# Patient Record
Sex: Female | Born: 2000
Health system: Southern US, Community
[De-identification: ages and names within clinical notes are randomized; demographics above are authoritative.]

## PROBLEM LIST (undated history)

## (undated) DIAGNOSIS — K219 Gastro-esophageal reflux disease without esophagitis: Secondary | ICD-10-CM

## (undated) DIAGNOSIS — IMO0001 Reserved for inherently not codable concepts without codable children: Secondary | ICD-10-CM

## (undated) DIAGNOSIS — J302 Other seasonal allergic rhinitis: Secondary | ICD-10-CM

## (undated) DIAGNOSIS — F329 Major depressive disorder, single episode, unspecified: Secondary | ICD-10-CM

## (undated) DIAGNOSIS — F32A Depression, unspecified: Secondary | ICD-10-CM

## (undated) DIAGNOSIS — K589 Irritable bowel syndrome without diarrhea: Secondary | ICD-10-CM

## (undated) HISTORY — DX: Irritable bowel syndrome, unspecified: K58.9

---
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
## 2018-02-01 DIAGNOSIS — N912 Amenorrhea, unspecified: Secondary | ICD-10-CM | POA: Diagnosis not present

## 2018-03-04 ENCOUNTER — Emergency Department
Admission: EM | Admit: 2018-03-04 | Discharge: 2018-03-04 | Disposition: A | Discharge status: Home / Self Care | Payer: BLUE CROSS/BLUE SHIELD | Attending: Emergency Medicine | Admitting: Emergency Medicine

## 2018-03-04 ENCOUNTER — Encounter: Payer: Self-pay | Admitting: Emergency Medicine

## 2018-03-04 ENCOUNTER — Emergency Department: Payer: BLUE CROSS/BLUE SHIELD

## 2018-03-04 DIAGNOSIS — O209 Hemorrhage in early pregnancy, unspecified: Secondary | ICD-10-CM | POA: Diagnosis present

## 2018-03-04 DIAGNOSIS — O26851 Spotting complicating pregnancy, first trimester: Secondary | ICD-10-CM | POA: Diagnosis not present

## 2018-03-04 DIAGNOSIS — Z3A11 11 weeks gestation of pregnancy: Secondary | ICD-10-CM | POA: Diagnosis not present

## 2018-03-04 DIAGNOSIS — Z2913 Encounter for prophylactic Rho(D) immune globulin: Secondary | ICD-10-CM | POA: Insufficient documentation

## 2018-03-04 DIAGNOSIS — O039 Complete or unspecified spontaneous abortion without complication: Secondary | ICD-10-CM | POA: Diagnosis not present

## 2018-03-04 DIAGNOSIS — Z3A01 Less than 8 weeks gestation of pregnancy: Secondary | ICD-10-CM | POA: Diagnosis not present

## 2018-03-04 DIAGNOSIS — O021 Missed abortion: Secondary | ICD-10-CM | POA: Diagnosis not present

## 2018-03-04 LAB — ANTIBODY SCREEN: Antibody Screen: NEGATIVE

## 2018-03-04 LAB — ABO/RH: ABO/RH(D): A NEG

## 2018-03-04 LAB — HCG, QUANTITATIVE, PREGNANCY: HCG, BETA CHAIN, QUANT, S: 2637 m[IU]/mL — AB (ref <5)

## 2018-03-04 MED ORDER — RHO D IMMUNE GLOBULIN 1500 UNIT/2ML IJ SOSY
300.0000 ug | PREFILLED_SYRINGE | Freq: Once | INTRAMUSCULAR | Status: AC
  Administered 2018-03-04: 300 ug via INTRAMUSCULAR
  Filled 2018-03-04: qty 2

## 2018-03-04 NOTE — ED Triage Notes (Signed)
Pt reports she is [redacted] weeks pregnant and started to have vaginal bleeding today. Pt denies clots. Pt was seen by OBGYN and confirmed pregnancy by [place] on January 14.

## 2018-03-04 NOTE — ED Provider Notes (Addendum)
[facility] Emergency Department Provider Note  ____________________________________________   I have reviewed the triage vital signs and the nursing notes.   HISTORY  Chief Complaint Vaginal Bleeding   History limited by: Not Limited   HPI Cynthia Larson is a 18 y.o. female who presents to the emergency department today with concerns for vaginal bleeding in the setting of early pregnancy.  Patient states that she is roughly [redacted] weeks pregnant by ultrasound.  Today noticed some bleeding.  She denies any blood clots.  She denies any associated abdominal pain.  States is the first time she has noticed any bleeding with this pregnancy this is the patient's first pregnancy.  She denies any significant nausea or vomiting.   Per medical record review patient has a history of depression, IBS.   Past Medical History:  Diagnosis Date  . Depression    no meds  . IBS (irritable bowel syndrome)    Cyclic constipation/Diarrhea - diet controlled - no meds  . Reflux   . Seasonal allergies     Patient Active Problem List   Diagnosis Date Noted  . IBS (irritable bowel syndrome)     Past Surgical History:  Procedure Laterality Date  . CYSTOSCOPY N/A 08/04/2013   Procedure: CYSTOSCOPY;  Surgeon: Daria Pastures, MD;  Location: [facility];  Service: Gynecology;  Laterality: N/A;  . ROBOTIC ASSISTED LAPAROSCOPIC LYSIS OF ADHESION N/A 08/04/2013   Procedure: ROBOTIC ASSISTED LAPAROSCOPIC LYSIS OF ADHESION;  Surgeon: Daria Pastures, MD;  Location: [facility];  Service: Gynecology;  Laterality: N/A;  . ROBOTIC ASSISTED LAPAROSCOPIC OVARIAN CYSTECTOMY Left 08/04/2013   Procedure: ROBOTIC ASSISTED LAPAROSCOPIC left salpingoopherectomy with dermoid cyst removal;  Surgeon: Daria Pastures, MD;  Location: [facility];  Service: Gynecology;  Laterality: Left;    Prior to Admission medications   Medication Sig Start Date End Date Taking? Authorizing Provider  cetirizine (ZYRTEC)  10 MG tablet Take 10 mg by mouth daily.    [provider]  HYDROcodone-acetaminophen (NORCO/VICODIN) 5-325 MG tablet Take 1 tablet by mouth every 6 (six) hours as needed. 03/07/16   Billy Fischer, MD  ibuprofen (ADVIL,MOTRIN) 200 MG tablet Take 600 mg by mouth daily.    [provider]  norethindrone-ethinyl estradiol (JUNEL FE 1/20) 1-20 MG-MCG tablet Take 1 tablet by mouth daily.    [provider]  ranitidine (ZANTAC) 150 MG capsule Take 150 mg by mouth daily.    [provider]  ranitidine (ZANTAC) 75 MG tablet Take 150 mg by mouth daily.     [provider]    Allergies Patient has no known allergies.  History reviewed. No pertinent family history.  Social History Social History   Tobacco Use  . Smoking status: Never Smoker  . Smokeless tobacco: Never Used  Substance Use Topics  . Alcohol use: No  . Drug use: No    Review of Systems Constitutional: No fever/chills Eyes: No visual changes. ENT: No sore throat. Cardiovascular: Denies chest pain. Respiratory: Denies shortness of breath. Gastrointestinal: No abdominal pain.  No nausea, no vomiting.  No diarrhea.   Genitourinary: Positive for vaginal bleeding.  Musculoskeletal: Negative for back pain. Skin: Negative for rash. Neurological: Negative for headaches, focal weakness or numbness.  ____________________________________________   PHYSICAL EXAM:  VITAL SIGNS: ED Triage Vitals [03/04/18 2030]  Enc Vitals Group     BP 133/78     Pulse Rate (!) 102     Resp 18     Temp  99 F (37.2 C)     Temp Source Oral     SpO2 100 %   Constitutional: Alert and oriented.  Eyes: Conjunctivae are normal.  ENT      Head: Normocephalic and atraumatic.      Nose: No congestion/rhinnorhea.      Mouth/Throat: Mucous membranes are moist.      Neck: No stridor. Hematological/Lymphatic/Immunilogical: No cervical lymphadenopathy. Cardiovascular: Normal rate, regular rhythm.  No  murmurs, rubs, or gallops. Respiratory: Normal respiratory effort without tachypnea nor retractions. Breath sounds are clear and equal bilaterally. No wheezes/rales/rhonchi. Gastrointestinal: Soft and non tender. No rebound. No guarding.  Genitourinary: Deferred Musculoskeletal: Normal range of motion in all extremities. No lower extremity edema. Neurologic:  Normal speech and language. No gross focal neurologic deficits are appreciated.  Skin:  Skin is warm, dry and intact. No rash noted. Psychiatric: Mood and affect are normal. Speech and behavior are normal. Patient exhibits appropriate insight and judgment.  ____________________________________________    LABS (pertinent positives/negatives)  hcg 2,637 A NEG  ____________________________________________   EKG  None  ____________________________________________    RADIOLOGY  [place] No cardiac activity.   ____________________________________________   PROCEDURES  Procedures  ____________________________________________   INITIAL IMPRESSION / ASSESSMENT AND PLAN / ED COURSE  Pertinent labs & imaging results that were available during my care of the patient were reviewed by me and considered in my medical decision making (see chart for details).   Patient presented to the emergency department today because of concerns for vaginal bleeding in first trimester.  Patient's beta hCG is only 2000 which is inconsistent with patient's dates.  Ultrasound unfortunately does not show any cardiac activity and is consistent with fetal demise.  Discussed this with the patient.  She was a negative so RhoGam was administered.  Discussed expectant management.  Did offer cytotec however patient declined. Also discussed D and C with patient as a possibility. At this time she would like to try expectant management. Discussed importance of following up with ob/gyn.  ____________________________________________   FINAL CLINICAL IMPRESSION(S)  / ED DIAGNOSES  Final diagnoses:  Miscarriage     Note: This dictation was prepared with Dragon dictation. Any transcriptional errors that result from this process are unintentional     Nance Pear, MD 03/04/18 [id number]    Nance Pear, MD 03/04/18 2242

## 2018-03-04 NOTE — ED Notes (Signed)
No peripheral IV placed this visit.    Discharge instructions reviewed with patient. Questions fielded by this RN. Patient verbalizes understanding of instructions. Patient discharged home in stable condition per [facility]. No acute distress noted at time of discharge.   No signs of reaction at IM site

## 2018-03-04 NOTE — ED Notes (Addendum)
ED Provider at bedside.  Pt reports 11 weeks preg ([place] confirmed with Hassan Buckler, NP at [facility]) , started "spotting" today, painless, denies sickness, nausea, vomiting or rashes  Pt with sig other  First preg

## 2018-03-04 NOTE — Discharge Instructions (Signed)
Please seek medical attention for any high fevers, chest pain, shortness of breath, change in behavior, persistent vomiting, bloody stool or any other new or concerning symptoms.  

## 2018-03-05 LAB — RHOGAM INJECTION: UNIT DIVISION: 0

## 2018-03-08 DIAGNOSIS — O034 Incomplete spontaneous abortion without complication: Secondary | ICD-10-CM | POA: Diagnosis not present

## 2018-03-16 DIAGNOSIS — O021 Missed abortion: Secondary | ICD-10-CM | POA: Diagnosis not present

## 2018-03-22 DIAGNOSIS — O021 Missed abortion: Secondary | ICD-10-CM | POA: Diagnosis not present

## 2018-03-22 DIAGNOSIS — Z3009 Encounter for other general counseling and advice on contraception: Secondary | ICD-10-CM | POA: Diagnosis not present

## 2018-04-06 DIAGNOSIS — O021 Missed abortion: Secondary | ICD-10-CM | POA: Diagnosis not present

## 2018-07-06 DIAGNOSIS — N912 Amenorrhea, unspecified: Secondary | ICD-10-CM | POA: Diagnosis not present

## 2018-07-25 DIAGNOSIS — N898 Other specified noninflammatory disorders of vagina: Secondary | ICD-10-CM | POA: Diagnosis not present

## 2018-07-25 DIAGNOSIS — O26891 Other specified pregnancy related conditions, first trimester: Secondary | ICD-10-CM | POA: Diagnosis not present

## 2018-07-25 DIAGNOSIS — Z3481 Encounter for supervision of other normal pregnancy, first trimester: Secondary | ICD-10-CM | POA: Diagnosis not present

## 2018-07-25 LAB — OB RESULTS CONSOLE ABO/RH: RH Type: NEGATIVE

## 2018-07-25 LAB — OB RESULTS CONSOLE VARICELLA ZOSTER ANTIBODY, IGG: Varicella: IMMUNE

## 2018-07-25 LAB — OB RESULTS CONSOLE HEPATITIS B SURFACE ANTIGEN: Hepatitis B Surface Ag: NEGATIVE

## 2018-07-25 LAB — OB RESULTS CONSOLE RUBELLA ANTIBODY, IGM: Rubella: IMMUNE

## 2018-08-23 DIAGNOSIS — Z3482 Encounter for supervision of other normal pregnancy, second trimester: Secondary | ICD-10-CM | POA: Diagnosis not present

## 2018-08-23 DIAGNOSIS — A749 Chlamydial infection, unspecified: Secondary | ICD-10-CM | POA: Diagnosis not present

## 2018-08-23 DIAGNOSIS — O98812 Other maternal infectious and parasitic diseases complicating pregnancy, second trimester: Secondary | ICD-10-CM | POA: Diagnosis not present

## 2018-09-28 DIAGNOSIS — R000 Tachycardia, unspecified: Secondary | ICD-10-CM | POA: Diagnosis not present

## 2018-10-10 DIAGNOSIS — N938 Other specified abnormal uterine and vaginal bleeding: Secondary | ICD-10-CM | POA: Diagnosis not present

## 2018-10-10 DIAGNOSIS — Z23 Encounter for immunization: Secondary | ICD-10-CM | POA: Diagnosis not present

## 2018-10-10 DIAGNOSIS — O368120 Decreased fetal movements, second trimester, not applicable or unspecified: Secondary | ICD-10-CM | POA: Diagnosis not present

## 2018-10-11 DIAGNOSIS — O26892 Other specified pregnancy related conditions, second trimester: Secondary | ICD-10-CM | POA: Diagnosis not present

## 2018-10-11 DIAGNOSIS — Z6791 Unspecified blood type, Rh negative: Secondary | ICD-10-CM | POA: Diagnosis not present

## 2018-10-11 DIAGNOSIS — O4690 Antepartum hemorrhage, unspecified, unspecified trimester: Secondary | ICD-10-CM | POA: Diagnosis not present

## 2018-11-24 DIAGNOSIS — Z6791 Unspecified blood type, Rh negative: Secondary | ICD-10-CM | POA: Diagnosis not present

## 2018-11-24 DIAGNOSIS — Z3482 Encounter for supervision of other normal pregnancy, second trimester: Secondary | ICD-10-CM | POA: Diagnosis not present

## 2018-11-24 DIAGNOSIS — O26893 Other specified pregnancy related conditions, third trimester: Secondary | ICD-10-CM | POA: Diagnosis not present

## 2018-12-22 DIAGNOSIS — Z23 Encounter for immunization: Secondary | ICD-10-CM | POA: Diagnosis not present

## 2019-01-17 DIAGNOSIS — Z3482 Encounter for supervision of other normal pregnancy, second trimester: Secondary | ICD-10-CM | POA: Diagnosis not present

## 2019-01-17 LAB — OB RESULTS CONSOLE GC/CHLAMYDIA
Chlamydia: NEGATIVE
Gonorrhea: NEGATIVE

## 2019-01-17 LAB — OB RESULTS CONSOLE RPR: RPR: NONREACTIVE

## 2019-01-17 LAB — OB RESULTS CONSOLE HIV ANTIBODY (ROUTINE TESTING): HIV: NONREACTIVE

## 2019-01-17 LAB — OB RESULTS CONSOLE GBS: GBS: NEGATIVE

## 2019-01-20 ENCOUNTER — Encounter: Payer: Self-pay | Admitting: *Deleted

## 2019-01-20 ENCOUNTER — Observation Stay
Admission: EM | Admit: 2019-01-20 | Discharge: 2019-01-20 | Disposition: A | Discharge status: Home / Self Care | Payer: Medicaid Other | Attending: Certified Nurse Midwife | Admitting: Certified Nurse Midwife

## 2019-01-20 DIAGNOSIS — O479 False labor, unspecified: Secondary | ICD-10-CM | POA: Diagnosis present

## 2019-01-20 DIAGNOSIS — Z791 Long term (current) use of non-steroidal anti-inflammatories (NSAID): Secondary | ICD-10-CM | POA: Diagnosis not present

## 2019-01-20 DIAGNOSIS — Z3A00 Weeks of gestation of pregnancy not specified: Secondary | ICD-10-CM | POA: Diagnosis not present

## 2019-01-20 NOTE — L&D Delivery Note (Signed)
Delivery Note  Date of delivery: 01/28/2019 Estimated Date of Delivery: 02/11/19 Patient's last menstrual period was 05/07/2018. EGA: [redacted]w[redacted]d  First Stage: Labor onset: 0530 01/28/2019 Augmentation : AROM Analgesia Cynthia Larson intrapartum: epidural AROM at Cynthia Larson presented to L&D with contractions. She was initially expectantly management. Epidural placed for pain relief. She was then augmented with AROM.    Second Stage: Complete dilation at 1629 Onset of pushing at 1639 FHR second stage category I Delivery at [id number] on 01/28/2019  She progressed to complete and had a spontaneous vaginal birth of a live female over an intact perineum. The fetal head was delivered in direct OA position with restitution to ROA. No nuchal cord. Anterior then posterior shoulders delivered with minimal assistance. Baby placed on mom's abdomen and attended to by transition RN. Cord clamped and cut after 2 minute delay by grandmother of the baby. Cord blood obtained for newborn labs.  Third Stage: Placenta delivered spontaneously intact with 3VC at 1657 Placenta disposition: routine disposal Uterine tone firm / bleeding scant IV pitocin given for hemorrhage prophylaxis  B/l labial and second degree vaginal lacerations identified  Anesthesia for repair: epidural Repair 3-0 Vicryl Rapide Est. Blood Loss (mL): 123XX123  Complications: none  Mom to postpartum.  Baby to Couplet care / Skin to Skin.  Newborn: Birth Weight: pending  Apgar Scores: 9, 9 Feeding planned: breast   Cynthia Larson, CNM 01/28/2019 5:25 PM

## 2019-01-20 NOTE — Discharge Summary (Signed)
Patient ID: Cynthia Larson MRN: [id number] DOB/AGE: 2000/07/28 19 y.o.  Admit date: 01/20/2019 Discharge date: 01/20/2019  Admission Diagnoses: Contractions  Discharge Diagnoses: Early labor vs [place]  Prenatal Procedures: NST  Consults: none  Significant Diagnostic Studies:  No results found for this or any previous visit (from the past 168 hour(s)).  Treatments: oral hydration  Hospital Course:  See triage note  Discharge Physical Exam:  BP 112/67 (BP Location: Left Arm)   Pulse 82   Temp 98.6 F (37 C) (Oral)   Resp 16   LMP 05/07/2018   Breastfeeding Unknown   General: NAD CV: RRR Pulm: CTABL, nl effort ABD: s/nd/nt, gravid DVT Evaluation: LE non-ttp, no evidence of DVT on exam.     Discharge Condition: Stable  Disposition: Discharge disposition: 01-Home or Self Care        Allergies as of 01/20/2019   No Known Allergies     Medication List    STOP taking these medications   HYDROcodone-acetaminophen 5-325 MG tablet Commonly known as: NORCO/VICODIN   ibuprofen 200 MG tablet Commonly known as: ADVIL   Junel FE 1/20 1-20 MG-MCG tablet Generic drug: norethindrone-ethinyl estradiol     TAKE these medications   cetirizine 10 MG tablet Commonly known as: ZYRTEC Take 10 mg by mouth daily.   prenatal multivitamin Tabs tablet Take 1 tablet by mouth daily at 12 noon.   ranitidine 150 MG capsule Commonly known as: ZANTAC Take 150 mg by mouth daily. What changed: Another medication with the same name was removed. Continue taking this medication, and follow the directions you see here.      Follow-up [facility] Follow up.   Contact information: [address] [place] [id number]          Signed:  Regina Eck 01/20/2019 5:21 AM

## 2019-01-20 NOTE — OB Triage Note (Addendum)
Recvd pt from ED. Pt c/o contractions every 3-4 min. Stated she was seen on 12/31 and was told to come in if contractions continued. No LOF or vaginal bleeding. Feeling baby move well. Rates pain a 6 out of 10. Pt reports not drinking much water today.

## 2019-01-20 NOTE — OB Triage Note (Signed)
Triage Visit with NST    Cynthia Larson is a 19 y.o. G1P0. She is at [redacted]w[redacted]d gestation. Patient's last menstrual period was 05/07/2018. Estimated Date of Delivery: 02/11/19 Prenatal care site: [facility]   Chief Complaint: Contractions  Subjective:  Resting comfortably.  Denies VB, LOF   Active fetal movement.   Past Medical History:  Diagnosis Date  . Depression    no meds  . IBS (irritable bowel syndrome)    Cyclic constipation/Diarrhea - diet controlled - no meds  . Reflux   . Seasonal allergies     Past Surgical History:  Procedure Laterality Date  . CYSTOSCOPY N/A 08/04/2013   Procedure: CYSTOSCOPY;  Surgeon: Daria Pastures, MD;  Location: [facility];  Service: Gynecology;  Laterality: N/A;  . ROBOTIC ASSISTED LAPAROSCOPIC LYSIS OF ADHESION N/A 08/04/2013   Procedure: ROBOTIC ASSISTED LAPAROSCOPIC LYSIS OF ADHESION;  Surgeon: Daria Pastures, MD;  Location: [facility];  Service: Gynecology;  Laterality: N/A;  . ROBOTIC ASSISTED LAPAROSCOPIC OVARIAN CYSTECTOMY Left 08/04/2013   Procedure: ROBOTIC ASSISTED LAPAROSCOPIC left salpingoopherectomy with dermoid cyst removal;  Surgeon: Daria Pastures, MD;  Location: [facility];  Service: Gynecology;  Laterality: Left;    No Known Allergies  Prior to Admission medications   Medication Sig Start Date End Date Taking? Authorizing Provider  Prenatal Vit-Fe Fumarate-FA (PRENATAL MULTIVITAMIN) TABS tablet Take 1 tablet by mouth daily at 12 noon.   Yes [provider]  cetirizine (ZYRTEC) 10 MG tablet Take 10 mg by mouth daily.    [provider]  HYDROcodone-acetaminophen (NORCO/VICODIN) 5-325 MG tablet Take 1 tablet by mouth every 6 (six) hours as needed. 03/07/16   Billy Fischer, MD  ibuprofen (ADVIL,MOTRIN) 200 MG tablet Take 600 mg by mouth daily.    [provider]  norethindrone-ethinyl estradiol (JUNEL FE 1/20) 1-20 MG-MCG tablet Take 1 tablet by mouth daily.    [provider]   ranitidine (ZANTAC) 150 MG capsule Take 150 mg by mouth daily.    [provider]  ranitidine (ZANTAC) 75 MG tablet Take 150 mg by mouth daily.     [provider]     Social History:  reports that she has never smoked. She has never used smokeless tobacco. She reports that she does not drink alcohol or use drugs.  Family History: family history is not on file.   Review of Systems: A full review of systems was performed and negative except as noted in the HPI.    Objective:  BP 112/67 (BP Location: Left Arm)   Pulse 82   Temp 98.6 F (37 C) (Oral)   Resp 16   LMP 05/07/2018   Breastfeeding Unknown  No results found for this or any previous visit (from the past 24 hour(s)).    Gen: NAD, AAOx3      Abd: FNTTP      Ext: Non-tender, Nonedmeatous    NST: FHR baseline: 135 bpm Variability: moderate Accelerations: yes Decelerations: none Category/reactivity: reactive  TOCO: Q 1-5 min  Presentations: cephalic  Cervix: @ [id number]   Dilation: 1cm   Effacement: 50%   Station:  -1   Consistency: medium   Position: middle   Cervix: @ 0203   Dilation: 2cm   Effacement: 50%   Station:  -1   Consistency: medium   Position: middle   Cervix: @ 0505   Dilation: 2cm   Effacement: 50%   Station:  -1   Consistency: medium   Position: middle  Assessment:   [name] here for r/o labor   Principle Diagnosis:  Early labor vs [place]  Plan:   Active Labor: not present.   Fetal Wellbeing: NST is reassuring, reactive tracing   D/c home stable, precautions reviewed, follow-up as scheduled.    - Kick counts reviewed with the patient in detail.  Patient instructed to assess for fetal activity daily at regular intervals. Counts to be done if decreased activity noted. Patient instructed to contact the office if counts do not reveal adequate movements.  - Preterm labor precautions (ROM, vaginal bleeding, contractions/lower back pain, change in  discharge), along with how and when to call the office, reviewed. Patient verbalized understanding.  Linda Hedges, CNM 01/20/2019 2:14 AM

## 2019-01-20 NOTE — Discharge Instructions (Signed)

## 2019-01-20 NOTE — Progress Notes (Signed)
Discharge instructions given. Labor precautions reviewed with pt and  Pt verbalized understanding and had no questions. Discharged home with mother.

## 2019-01-28 ENCOUNTER — Inpatient Hospital Stay: Payer: Medicaid Other | Admitting: Anesthesiology

## 2019-01-28 ENCOUNTER — Inpatient Hospital Stay
Admission: EM | Admit: 2019-01-28 | Discharge: 2019-01-29 | DRG: 807 | Disposition: A | Discharge status: Home / Self Care | Payer: Medicaid Other | Attending: Certified Nurse Midwife | Admitting: Certified Nurse Midwife

## 2019-01-28 ENCOUNTER — Encounter: Payer: Self-pay | Admitting: Obstetrics and Gynecology

## 2019-01-28 ENCOUNTER — Other Ambulatory Visit: Payer: Self-pay

## 2019-01-28 DIAGNOSIS — O26893 Other specified pregnancy related conditions, third trimester: Secondary | ICD-10-CM | POA: Diagnosis present

## 2019-01-28 DIAGNOSIS — Z3A38 38 weeks gestation of pregnancy: Secondary | ICD-10-CM

## 2019-01-28 DIAGNOSIS — O358XX0 Maternal care for other (suspected) fetal abnormality and damage, not applicable or unspecified: Principal | ICD-10-CM | POA: Diagnosis present

## 2019-01-28 DIAGNOSIS — Z6791 Unspecified blood type, Rh negative: Secondary | ICD-10-CM | POA: Diagnosis not present

## 2019-01-28 DIAGNOSIS — Z20822 Contact with and (suspected) exposure to covid-19: Secondary | ICD-10-CM | POA: Diagnosis present

## 2019-01-28 LAB — CBC
HCT: 36.6 % (ref 36.0–46.0)
Hemoglobin: 12.5 g/dL (ref 12.0–15.0)
MCH: 29.3 pg (ref 26.0–34.0)
MCHC: 34.2 g/dL (ref 30.0–36.0)
MCV: 85.9 fL (ref 80.0–100.0)
Platelets: 149 10*3/uL — ABNORMAL LOW (ref 150–400)
RBC: 4.26 MIL/uL (ref 3.87–5.11)
RDW: 12.8 % (ref 11.5–15.5)
WBC: 12.4 10*3/uL — ABNORMAL HIGH (ref 4.0–10.5)
nRBC: 0 % (ref 0.0–0.2)

## 2019-01-28 LAB — RESPIRATORY PANEL BY RT PCR (FLU A&B, COVID)
Influenza A by PCR: NEGATIVE
Influenza B by PCR: NEGATIVE
SARS Coronavirus 2 by RT PCR: NEGATIVE

## 2019-01-28 MED ORDER — ACETAMINOPHEN 325 MG PO TABS: 650.0000 mg | ORAL_TABLET | ORAL | Status: DC | PRN

## 2019-01-28 MED ORDER — LIDOCAINE HCL (PF) 1 % IJ SOLN: 30.0000 mL | INTRAMUSCULAR | Status: DC | PRN

## 2019-01-28 MED ORDER — LACTATED RINGERS IV SOLN: 500.0000 mL | Freq: Once | INTRAVENOUS | Status: DC

## 2019-01-28 MED ORDER — FENTANYL 2.5 MCG/ML W/ROPIVACAINE 0.15% IN NS 100 ML EPIDURAL (ARMC)
12.0000 mL/h | EPIDURAL | Status: DC
  Administered 2019-01-28: 14:00:00 12 mL/h via EPIDURAL

## 2019-01-28 MED ORDER — OXYTOCIN 40 UNITS IN NORMAL SALINE INFUSION - SIMPLE MED
2.5000 [IU]/h | INTRAVENOUS | Status: DC
  Administered 2019-01-28: 17:00:00 2.5 [IU]/h via INTRAVENOUS

## 2019-01-28 MED ORDER — DIPHENHYDRAMINE HCL 25 MG PO CAPS: 25.0000 mg | ORAL_CAPSULE | Freq: Four times a day (QID) | ORAL | Status: DC | PRN

## 2019-01-28 MED ORDER — OXYTOCIN 40 UNITS IN NORMAL SALINE INFUSION - SIMPLE MED
INTRAVENOUS | Status: AC
  Filled 2019-01-28: qty 1000

## 2019-01-28 MED ORDER — EPHEDRINE 5 MG/ML INJ: 10.0000 mg | INTRAVENOUS | Status: DC | PRN

## 2019-01-28 MED ORDER — PRENATAL MULTIVITAMIN CH
1.0000 | ORAL_TABLET | Freq: Every day | ORAL | Status: DC
  Administered 2019-01-29: 13:00:00 1 via ORAL
  Filled 2019-01-28: qty 1

## 2019-01-28 MED ORDER — SENNOSIDES-DOCUSATE SODIUM 8.6-50 MG PO TABS
2.0000 | ORAL_TABLET | ORAL | Status: DC
  Administered 2019-01-29: 2 via ORAL
  Filled 2019-01-28: qty 2

## 2019-01-28 MED ORDER — LACTATED RINGERS IV SOLN: 500.0000 mL | INTRAVENOUS | Status: DC | PRN

## 2019-01-28 MED ORDER — LIDOCAINE-EPINEPHRINE (PF) 1.5 %-1:200000 IJ SOLN
INTRAMUSCULAR | Status: DC | PRN
  Administered 2019-01-28: 3 mL via EPIDURAL

## 2019-01-28 MED ORDER — MISOPROSTOL 200 MCG PO TABS
ORAL_TABLET | ORAL | Status: AC
  Filled 2019-01-28: qty 4

## 2019-01-28 MED ORDER — PHENYLEPHRINE 40 MCG/ML (10ML) SYRINGE FOR IV PUSH (FOR BLOOD PRESSURE SUPPORT): 80.0000 ug | PREFILLED_SYRINGE | INTRAVENOUS | Status: DC | PRN

## 2019-01-28 MED ORDER — LIDOCAINE HCL (PF) 1 % IJ SOLN
INTRAMUSCULAR | Status: AC
  Filled 2019-01-28: qty 30

## 2019-01-28 MED ORDER — ACETAMINOPHEN 325 MG PO TABS
650.0000 mg | ORAL_TABLET | ORAL | Status: DC | PRN
  Administered 2019-01-28: 22:00:00 650 mg via ORAL
  Filled 2019-01-28: qty 2

## 2019-01-28 MED ORDER — ONDANSETRON HCL 4 MG PO TABS: 4.0000 mg | ORAL_TABLET | ORAL | Status: DC | PRN

## 2019-01-28 MED ORDER — OXYTOCIN BOLUS FROM INFUSION
500.0000 mL | Freq: Once | INTRAVENOUS | Status: AC
  Administered 2019-01-28: 500 mL via INTRAVENOUS

## 2019-01-28 MED ORDER — AMMONIA AROMATIC IN INHA
RESPIRATORY_TRACT | Status: AC
  Filled 2019-01-28: qty 10

## 2019-01-28 MED ORDER — BENZOCAINE-MENTHOL 20-0.5 % EX AERO
1.0000 "application " | INHALATION_SPRAY | CUTANEOUS | Status: DC | PRN
  Administered 2019-01-28: 1 via TOPICAL
  Filled 2019-01-28: qty 56

## 2019-01-28 MED ORDER — SIMETHICONE 80 MG PO CHEW: 160.0000 mg | CHEWABLE_TABLET | ORAL | Status: DC | PRN

## 2019-01-28 MED ORDER — WITCH HAZEL-GLYCERIN EX PADS: 1.0000 "application " | MEDICATED_PAD | CUTANEOUS | Status: DC

## 2019-01-28 MED ORDER — DIBUCAINE (PERIANAL) 1 % EX OINT: 1.0000 "application " | TOPICAL_OINTMENT | CUTANEOUS | Status: DC | PRN

## 2019-01-28 MED ORDER — IBUPROFEN 600 MG PO TABS
600.0000 mg | ORAL_TABLET | Freq: Four times a day (QID) | ORAL | Status: DC
  Administered 2019-01-28 – 2019-01-29 (×3): 600 mg via ORAL
  Filled 2019-01-28 (×4): qty 1

## 2019-01-28 MED ORDER — DIPHENHYDRAMINE HCL 50 MG/ML IJ SOLN: 12.5000 mg | INTRAMUSCULAR | Status: DC | PRN

## 2019-01-28 MED ORDER — SOD CITRATE-CITRIC ACID 500-334 MG/5ML PO SOLN: 30.0000 mL | ORAL | Status: DC | PRN

## 2019-01-28 MED ORDER — LACTATED RINGERS IV SOLN: INTRAVENOUS | Status: DC

## 2019-01-28 MED ORDER — COCONUT OIL OIL: 1.0000 "application " | TOPICAL_OIL | Status: DC | PRN

## 2019-01-28 MED ORDER — ONDANSETRON HCL 4 MG/2ML IJ SOLN: 4.0000 mg | Freq: Four times a day (QID) | INTRAMUSCULAR | Status: DC | PRN

## 2019-01-28 MED ORDER — OXYTOCIN 10 UNIT/ML IJ SOLN
INTRAMUSCULAR | Status: AC
  Filled 2019-01-28: qty 2

## 2019-01-28 MED ORDER — ONDANSETRON HCL 4 MG/2ML IJ SOLN
4.0000 mg | INTRAMUSCULAR | Status: DC | PRN
  Administered 2019-01-28: 21:00:00 4 mg via INTRAVENOUS
  Filled 2019-01-28: qty 2

## 2019-01-28 MED ORDER — BUTORPHANOL TARTRATE 1 MG/ML IJ SOLN: 1.0000 mg | INTRAMUSCULAR | Status: DC | PRN

## 2019-01-28 MED ORDER — FENTANYL 2.5 MCG/ML W/ROPIVACAINE 0.15% IN NS 100 ML EPIDURAL (ARMC)
EPIDURAL | Status: AC
  Filled 2019-01-28: qty 100

## 2019-01-28 NOTE — Discharge Instructions (Signed)
After Your Delivery Discharge Instructions   Postpartum: Care Instructions  After childbirth (postpartum period), your body goes through many changes. Some of these changes happen over several weeks. In the hours after delivery, your body will begin to recover from childbirth while it prepares to breastfeed your newborn. You may feel emotional during this time. Your hormones can shift your mood without warning for no clear reason.  In the first couple of weeks after childbirth, many women have emotions that change from happy to sad. You may find it hard to sleep. You may cry a lot. This is called the "baby blues." These overwhelming emotions often go away within a couple of days or weeks. But it's important to discuss your feelings with your doctor.  You should call your care provider if you have unrelieved feelings of:  Inability to cope  Sadness  Anxiety  Lack of interest in baby  Insomnia  Crying  It is easy to get too tired and overwhelmed during the first weeks after childbirth. Don't try to do too much. Get rest whenever you can, accept help from others, and eat well and drink plenty of fluids.  About 4 to 6 weeks after your baby's birth, you will have a follow-up visit with your care provider. This visit is your time to talk to your provider about anything you are concerned or curious about.  Follow-up care is a key part of your treatment and safety. Be sure to make and go to all appointments, and call your doctor if you are having problems. It's also a good idea to know your test results and keep a list of the medicines you take.  How can you care for yourself at home?  Sleep or rest when your baby sleeps.  Get help with household chores from family or friends, if you can. Do not try to do it all yourself.  If you have hemorrhoids or swelling or pain around the opening of your vagina, try using cold and heat. You can put ice or a cold pack on the area for 10 to 20 minutes at  a time. Put a thin cloth between the ice and your skin. Also try sitting in a few inches of warm water (sitz bath) 3 times a day and after bowel movements.  Take pain medicines exactly as directed.  If the provider gave you a prescription medicine for pain, take it as prescribed.  If you do not have a prescription and need something over the counter, you can take:  Ibuprofen (Motrin, Advil) up to 600mg every 6 hours as needed for pain  Acetaminophen (Tylenol) up to 650mg every 4 hours as needed for pain  Some people find it helpful to alternate between these two medications.   No driving for 1-2 weeks or while taking pain medications.   Eat more fiber to avoid constipation. Include foods such as whole-grain breads and cereals, raw vegetables, raw and dried fruits, and beans.  Drink plenty of fluids, enough so that your urine is light yellow or clear like water. If you have kidney, heart, or liver disease and have to limit fluids, talk with your doctor before you increase the amount of fluids you drink.  Do not put anything in the vagina for 6 weeks. This means no sex, no tampons, no douching, and no enemas.  If you have stitches, keep the area clean by pouring or spraying warm water over the area outside your vagina and anus after you use the toilet.    No strenuous activity or heavy lifting for 6 weeks   No tub baths; showers only  Continue prenatal vitamin and iron.  If breastfeeding:  Increase calories and fluids while breastfeeding.  You may have a slight fever when your milk comes in, but it should go away on its own. If it does not, and rises above 101.0 please call the doctor.  For breastfeeding concerns, the lactation consultant can be reached at [phone].  For concerns about your baby, please call your pediatrician.   Keep a list of questions to bring to your postpartum visit. Your questions might be about:  Changes in your breasts, such as lumps or  soreness.  When to expect your menstrual period to start again.  What form of birth control is best for you.  Weight you have put on during the pregnancy.  Exercise options.  What foods and drinks are best for you, especially if you are breastfeeding.  Problems you might be having with breastfeeding.  When you can have sex. Some women may want to talk about lubricants for the vagina.  Any feelings of sadness or restlessness that you are having.   When should you call for help?  Call 911 anytime you think you may need emergency care. For example, call if:  You have thoughts of harming yourself, your baby, or another person.  You passed out (lost consciousness).  Call the office at [phone] or seek immediate medical care if:  If you have heavy bleeding such that you are soaking 1 pad in an hour for 2 hours  You are dizzy or lightheaded, or you feel like you may faint.  You have a fever; a temperature of 101.0 F or greater  Chills  Difficulty urinating  Headache unrelieved by "pain meds"   Visual changes  Pain in the right side of your belly near your ribs  Breasts reddened, hard, hot to the touch or any other breast concerns  Nipple discharge which is foul-smelling or contains pus   Increased pain at the site of the tear   New pain unrelieved with recommended over-the-counter dosages  Difficulty breathing with or without chest pain   New leg pain, swelling, or redness, especially if it is only on one leg  Any other concerns  Watch closely for changes in your health, and be sure to contact your provider if:  You have new or worse vaginal discharge.  You feel sad or depressed.  You are having problems with your breasts or breastfeeding.

## 2019-01-28 NOTE — Anesthesia Procedure Notes (Signed)
Epidural Patient location during procedure: OB Start time: 01/28/2019 1:16 PM End time: 01/28/2019 1:30 PM  Staffing Anesthesiologist: Alphonsus Sias, MD Performed: anesthesiologist   Preanesthetic Checklist Completed: patient identified, IV checked, site marked, risks and benefits discussed, surgical consent, monitors and equipment checked, pre-op evaluation and timeout performed  Epidural Patient position: sitting Prep: ChloraPrep Patient monitoring: heart rate, continuous pulse ox and blood pressure Approach: right paramedian Location: L3-L4 Injection technique: LOR saline  Needle:  Needle type: Tuohy  Needle gauge: 18 G Needle length: 9 cm and 9 Needle insertion depth: 5.5 cm Catheter type: closed end flexible Catheter size: 20 Guage Test dose: negative and 1.5% lidocaine with Epi 1:200 K  Assessment Events: blood not aspirated, injection not painful, no injection resistance, no paresthesia and negative IV test  Additional Notes   Patient tolerated the insertion well without complications. 1st pass LOR no difficulties.Reason for block:procedure for pain

## 2019-01-28 NOTE — Discharge Summary (Signed)
Obstetric Discharge Summary   Patient Name: Cynthia Larson DOB: [birth year] MRN: [id number]  Date of Admission: 01/28/2019 Date of Delivery: 01/28/2019 Delivered by: Cynthia Larson, CNM Date of Discharge: 01/29/2019  [facility] OB: [facility]  [id number] last menstrual period was 05/07/2018. EDC Estimated Date of Delivery: 02/11/19 Gestational Age at Delivery: [redacted]w[redacted]d   Antepartum complications:  1. Palpitations at [redacted]w[redacted]d, history of POTS: CBC, TSH, and EKG WNL 2. Rh negative, s/p RhoGAM 10/11/2018, 11/24/2018 3. Chlamydia at new OB, treated, retest negative 08/23/2018 4. Mild fetal hydronephrosis: 7.17mm at 37w  Admitting Diagnosis: labor  Secondary Diagnoses: Patient Active Problem List   Diagnosis Date Noted  . Labor and delivery indication for care or intervention 01/28/2019  . Uterine contractions during pregnancy 01/20/2019  . IBS (irritable bowel syndrome)     Augmentation: AROM Complications: None Intrapartum complications/course: She presented to L&D with contractions. She was initially expectantly management. Epidural placed for pain relief. She was then augmented with AROM. She progressed to complete and had a spontaneous vaginal birth of a live female over an intact perineum. The fetal head was delivered in direct OA position with restitution to ROA. No nuchal cord. Anterior then posterior shoulders delivered with minimal assistance. Baby placed on mom's abdomen and attended to by transition RN. Cord clamped and cut after 2 minute delay by grandmother of the baby. Cord blood obtained for newborn labs. Placenta delivered spontaneously intact with 3VC. IV pitocin given for hemorrhage prophylaxis. B/l labial and second degree vaginal lacerations identified. Vaginal laceration repaired for hemostasis. Right labial laceration repaired for cosmesis.   Delivery Type: spontaneous vaginal delivery Anesthesia: epidural Placenta: spontaneous Laceration: b/l labial, second degree  vaginal Episiotomy: none  Newborn Data: Live born female "[place]" Birth Weight:  3500g 7lb 11.5oz APGAR: 9, 9  Newborn Delivery   Birth date/time: 01/28/2019 16:52:00 Delivery type: Vaginal, Spontaneous      Postpartum Course  Patient had an uncomplicated postpartum course.  By time of discharge on PPD#1, her pain was controlled on oral pain medications; she had appropriate lochia and was ambulating, voiding without difficulty and tolerating regular diet.  She was deemed stable for discharge to home.       Labs: CBC Latest Ref Rng & Units 01/29/2019 01/28/2019 12/06/2017  WBC 4.0 - 10.5 K/uL 13.1(H) 12.4(H) 6.8  Hemoglobin 12.0 - 15.0 g/dL 11.2(L) 12.5 14.5  Hematocrit 36.0 - 46.0 % 33.1(L) 36.6 42.6  Platelets 150 - 400 K/uL 152 149(L) 262   A NEG  Physical exam:  BP 106/70 (BP Location: Right Arm)   Pulse 72   Temp 98.6 F (37 C) (Oral)   Resp 20   Ht 5\' 6"  (1.676 m)   Wt 75.8 kg   LMP 05/07/2018   SpO2 98%   Breastfeeding Unknown   BMI 26.95 kg/m  General: alert and no distress Pulm: normal respiratory effort Lochia: appropriate Abdomen: soft, NT Uterine Fundus: firm, below umbilicus Extremities: No evidence of DVT seen on physical exam. No lower extremity edema.   Disposition: stable, discharge to home Baby Feeding: breastmilk & formula Baby Disposition: home with mom  Contraception: OCPs  Prenatal Labs:  Blood type/Rh A neg  Antibody screen neg  Rubella Immune  Varicella Immune  RPR NR  HBsAg Neg  HIV NR  GC neg  Chlamydia Positive at 11w, treated, negative at 36w  Genetic screening cfDNA negative, XY; AFP negative  1 hour GTT 84  3 hour GTT n/a  GBS negative   Rh Immune  globulin given: n/a, baby A neg Rubella vaccine given: n/a Varicella vaccine given: n/ Tdap vaccine given in AP or PP setting: AP 12/22/2018 Flu vaccine given in AP or PP setting: AP 10/10/2018  Plan:  Cynthia Larson was discharged to home in good condition. Follow-up  appointment at [facility] with delivery provider in 6 weeks  Discharge Instructions: Per After Visit Summary. Activity: Advance as tolerated. Pelvic rest for 6 weeks.   Diet: Regular Discharge Medications: Allergies as of 01/29/2019   No Known Allergies     Medication List    STOP taking these medications   ondansetron 4 MG tablet Commonly known as: ZOFRAN   ranitidine 150 MG capsule Commonly known as: ZANTAC     TAKE these medications   acetaminophen 325 MG tablet Commonly known as: Tylenol Take 2 tablets (650 mg total) by mouth every 4 (four) hours as needed for mild pain or moderate pain.   cetirizine 10 MG tablet Commonly known as: ZYRTEC Take 10 mg by mouth daily.   ibuprofen 600 MG tablet Commonly known as: ADVIL Take 1 tablet (600 mg total) by mouth every 6 (six) hours.   prenatal multivitamin Tabs tablet Take 1 tablet by mouth daily at 12 noon.      Outpatient follow up:  Follow-up Information    Cynthia Larson, CNM. Schedule an appointment as soon as possible for a visit in 6 week(s).   Specialty: Certified Nurse Midwife Why: For routine postpartum visit Contact information: [address] [place] [address] [phone]            Signed:  Lisette Larson, CNM 01/29/2019 5:30 PM

## 2019-01-28 NOTE — H&P (Signed)
OB History & Physical   History of Present Illness:  Chief Complaint: contractions  HPI:  Cynthia Larson is a 19 y.o. G2P0010 female at [redacted]w[redacted]d dated by LMP c/w [redacted]w[redacted]d ultrasound.  She presents to L&D with reports of contractions.   She reports:  -active fetal movement -no leakage of fluid  -no vaginal bleeding -onset of contractions at 0530 currently every 3-4 minutes  Pregnancy Issues: 1. Palpitations at [redacted]w[redacted]d, history of POTS: CBC, TSH, and EKG WNL 2. Rh negative, s/p RhoGAM 10/11/2018, 11/24/2018 3. Chlamydia at new OB, treated, retest negative 08/23/2018 4. Mild fetal hydronephrosis: 7.62mm at 37w   Maternal Medical History:   Past Medical History:  Diagnosis Date  . Depression    no meds  . IBS (irritable bowel syndrome)    Cyclic constipation/Diarrhea - diet controlled - no meds  . Reflux   . Seasonal allergies     Past Surgical History:  Procedure Laterality Date  . CYSTOSCOPY N/A 08/04/2013   Procedure: CYSTOSCOPY;  Surgeon: Daria Pastures, MD;  Location: [facility];  Service: Gynecology;  Laterality: N/A;  . ROBOTIC ASSISTED LAPAROSCOPIC LYSIS OF ADHESION N/A 08/04/2013   Procedure: ROBOTIC ASSISTED LAPAROSCOPIC LYSIS OF ADHESION;  Surgeon: Daria Pastures, MD;  Location: [facility];  Service: Gynecology;  Laterality: N/A;  . ROBOTIC ASSISTED LAPAROSCOPIC OVARIAN CYSTECTOMY Left 08/04/2013   Procedure: ROBOTIC ASSISTED LAPAROSCOPIC left salpingoopherectomy with dermoid cyst removal;  Surgeon: Daria Pastures, MD;  Location: [facility];  Service: Gynecology;  Laterality: Left;    No Known Allergies  Prior to Admission medications   Medication Sig Start Date End Date Taking? Authorizing Provider  ondansetron (ZOFRAN) 4 MG tablet Take 4 mg by mouth every 8 (eight) hours as needed for nausea or vomiting.   Yes [provider]  Prenatal Vit-Fe Fumarate-FA (PRENATAL MULTIVITAMIN) TABS tablet Take 1 tablet by mouth daily at 12 noon.   Yes [provider]   cetirizine (ZYRTEC) 10 MG tablet Take 10 mg by mouth daily.    [provider]  ranitidine (ZANTAC) 150 MG capsule Take 150 mg by mouth daily.    [provider]     Prenatal care site: [facility]   Social History: She  reports that she has never smoked. She has never used smokeless tobacco. She reports that she does not drink alcohol or use drugs.  Family History: family history includes Heart attack in her father; Thyroid disease in her mother.   Review of Systems: A full review of systems was performed and negative except as noted in the HPI.    Physical Exam:  Vital Signs: BP 123/79 (BP Location: Right Arm)   Pulse (!) 101   Temp 98.6 F (37 C) (Oral)   Resp 16   Ht 5\' 6"  (1.676 m)   Wt 75.8 kg   LMP 05/07/2018   SpO2 99%   BMI 26.95 kg/m   General:   alert, cooperative and appears stated age  Skin:  normal and no rash or abnormalities  Neurologic:    Alert & oriented x 3  Lungs:   clear to auscultation bilaterally  Heart:   regular rate and rhythm, S1, S2 normal, no murmur, click, rub or gallop  Abdomen:  soft, non-tender; bowel sounds normal; no masses,  no organomegaly  Pelvis:  Exam deferred.  FHT:  145 BPM  Presentations: cephalic  Cervix:  per RN Tresa Moore [place] at [phone]   Dilation: 4cm   Effacement: 80%  Station:  -1   Consistency: soft   Position: anterior  Extremities: : non-tender, symmetric, no edema bilaterally.     EFW: 6lb 10oz  No results found for this or any previous visit (from the past 24 hour(s)).  Pertinent Results:  Prenatal Labs: Blood type/Rh A neg  Antibody screen neg  Rubella Immune  Varicella Immune  RPR NR  HBsAg Neg  HIV NR  GC neg  Chlamydia Positive at 11w, treated, negative at 36w  Genetic screening cfDNA negative, XY; AFP negative  1 hour GTT 84  3 hour GTT n/a  GBS negative   FHT: FHR: 140 bpm, variability: moderate,  accelerations:  Present,  decelerations:  Absent Category/reactivity:   Category I TOCO: regular, every 2-3 minutes  Assessment:  Cynthia Larson is a 19 y.o. G73P0010 female at [redacted]w[redacted]d with labor.   Plan:  1. Admit to Labor & Delivery; consents reviewed and obtained  2. Fetal Well being  - Fetal Tracing: category I - GBS negative - Presentation: cephalic confirmed by [facility]'s   3. Routine OB: - Prenatal labs reviewed, as above - Rh negative - CBC & T&S on admit - Clear fluids, IVF  4. Monitoring of Labor: -  Contractions to be monitored with external toco in place -  Plan for expectant management -  Plan for continuous fetal monitoring  -  Maternal pain control as desired: IVPM, regional anesthesia - Anticipate vaginal delivery  5. Post Partum Planning: - Infant feeding: breast and formula - Contraception: Nexplanon  Cynthia Larson, CNM 01/28/2019 9:50 AM ----- Cynthia Larson Certified Nurse Midwife [facility], Department of [facility]

## 2019-01-28 NOTE — OB Triage Note (Signed)
CTX since 0530. Ctx q 3-4 minutes. Denies LOF, vaginal bleeding. Reports good fetal movement. Cynthia Larson

## 2019-01-28 NOTE — Anesthesia Preprocedure Evaluation (Signed)
Anesthesia Evaluation  Patient identified by MRN, date of birth, ID band Patient awake    Reviewed: Allergy & Precautions, H&P , NPO status , reviewed documented beta blocker date and time   Airway Mallampati: II  TM Distance: >3 FB     Dental  (+) Teeth Intact   Pulmonary    Pulmonary exam normal        Cardiovascular Normal cardiovascular exam     Neuro/Psych PSYCHIATRIC DISORDERS Depression    GI/Hepatic   Endo/Other    Renal/GU      Musculoskeletal   Abdominal   Peds  Hematology   Anesthesia Other Findings Past Medical History: No date: Depression     Comment:  no meds No date: IBS (irritable bowel syndrome)     Comment:  Cyclic constipation/Diarrhea - diet controlled - no meds No date: Reflux No date: Seasonal allergies  Past Surgical History: 08/04/2013: CYSTOSCOPY; N/A     Comment:  Procedure: CYSTOSCOPY;  Surgeon: Daria Pastures, MD;              Location: [facility];  Service: Gynecology;  Laterality: N/A; 08/04/2013: ROBOTIC ASSISTED LAPAROSCOPIC LYSIS OF ADHESION; N/A     Comment:  Procedure: ROBOTIC ASSISTED LAPAROSCOPIC LYSIS OF               ADHESION;  Surgeon: Daria Pastures, MD;  Location: [facility]              [facility];  Service: Gynecology;  Laterality: N/A; 08/04/2013: ROBOTIC ASSISTED LAPAROSCOPIC OVARIAN CYSTECTOMY; Left     Comment:  Procedure: ROBOTIC ASSISTED LAPAROSCOPIC left               salpingoopherectomy with dermoid cyst removal;  Surgeon:               Daria Pastures, MD;  Location: [facility];  Service:               Gynecology;  Laterality: Left;  BMI    Body Mass Index: 26.95 kg/m      Reproductive/Obstetrics                             Anesthesia Physical Anesthesia Plan  ASA: II  Anesthesia Plan: Epidural   Post-op Pain Management:    Induction:   PONV Risk Score and Plan: 2 and Treatment may vary due to age or medical condition  Airway  Management Planned:   Additional Equipment:   Intra-op Plan:   Post-operative Plan: Extubation in OR  Informed Consent: I have reviewed the patients History and Physical, chart, labs and discussed the procedure including the risks, benefits and alternatives for the proposed anesthesia with the patient or authorized representative who has indicated his/her understanding and acceptance.     Dental advisory given  Plan Discussed with: CRNA  Anesthesia Plan Comments:         Anesthesia Quick Evaluation

## 2019-01-28 NOTE — Progress Notes (Signed)
Labor Progress Note  Cynthia Larson is a 19 y.o. G2P0010 at [redacted]w[redacted]d by LMP c/w [redacted]w[redacted]d ultrasound admitted for labor.  Subjective:  Comfortable with epidural.   Objective: BP 108/68   Pulse (!) 103   Temp 98.4 F (36.9 C) (Oral)   Resp 16   Ht 5\' 6"  (1.676 m)   Wt 75.8 kg   LMP 05/07/2018   SpO2 99%   BMI 26.95 kg/m   Fetal Assessment: FHT:  FHR: 135-140 bpm, variability: moderate,  accelerations:  Present,  decelerations:  Absent Category/reactivity:  Category I UC:   regular, every 2-5 minutes SVE:    Dilation: 5-6cm  Effacement: 80-90%  Station:  0 to +1  Consistency: soft  Position: anterior  Membrane status: AROM now Amniotic color: clear  Labs: Lab Results  Component Value Date   WBC 12.4 (H) 01/28/2019   HGB 12.5 01/28/2019   HCT 36.6 01/28/2019   MCV 85.9 01/28/2019   PLT 149 (L) 01/28/2019    Assessment / Plan: Spontaneous labor, progressing normally  Labor: Progressing normally, s/p AROM now Fetal Wellbeing:  Category I Pain Control:  Epidural I/D:  n/a Anticipated MOD:  NSVD  Lisette Grinder, CNM 01/28/2019, 2:58 PM

## 2019-01-28 NOTE — Progress Notes (Signed)
Labor Progress Note  Cynthia Larson is a 19 y.o. G2P0010 at [redacted]w[redacted]d by LMP c/w [redacted]w[redacted]d ultrasound admitted for labor.  Subjective:  Starting to feel stronger contractions.   Objective: BP 123/79 (BP Location: Right Arm)   Pulse (!) 101   Temp 98.4 F (36.9 C) (Oral)   Resp 16   Ht 5\' 6"  (1.676 m)   Wt 75.8 kg   LMP 05/07/2018   SpO2 99%   BMI 26.95 kg/m   Fetal Assessment: FHT:  FHR: 135-145 bpm, variability: moderate,  accelerations:  Present,  decelerations:  Absent Category/reactivity:  Category I UC:   regular, every 2-3 minutes SVE:    Dilation: 5cm  Effacement: 80-90%  Station:  0  Consistency: soft  Position: anterior  Membrane status: intact Amniotic color: n/a  Labs: Lab Results  Component Value Date   WBC 12.4 (H) 01/28/2019   HGB 12.5 01/28/2019   HCT 36.6 01/28/2019   MCV 85.9 01/28/2019   PLT 149 (L) 01/28/2019    Assessment / Plan: Spontaneous labor, progressing normally  Labor: Progressing normally, continue expectant management Fetal Wellbeing:  Category I Pain Control:  Desires epidural now I/D:  n/a Anticipated MOD:  NSVD  Lisette Grinder, CNM 01/28/2019, 12:35 PM

## 2019-01-29 LAB — TYPE AND SCREEN
ABO/RH(D): A NEG
Antibody Screen: POSITIVE
Unit division: 0
Unit division: 0

## 2019-01-29 LAB — BPAM RBC
Blood Product Expiration Date: 202101192359
Blood Product Expiration Date: 202101222359
Unit Type and Rh: 600
Unit Type and Rh: 600

## 2019-01-29 LAB — CBC
HCT: 33.1 % — ABNORMAL LOW (ref 36.0–46.0)
Hemoglobin: 11.2 g/dL — ABNORMAL LOW (ref 12.0–15.0)
MCH: 29.2 pg (ref 26.0–34.0)
MCHC: 33.8 g/dL (ref 30.0–36.0)
MCV: 86.2 fL (ref 80.0–100.0)
Platelets: 152 10*3/uL (ref 150–400)
RBC: 3.84 MIL/uL — ABNORMAL LOW (ref 3.87–5.11)
RDW: 12.6 % (ref 11.5–15.5)
WBC: 13.1 10*3/uL — ABNORMAL HIGH (ref 4.0–10.5)
nRBC: 0 % (ref 0.0–0.2)

## 2019-01-29 LAB — RPR: RPR Ser Ql: NONREACTIVE

## 2019-01-29 MED ORDER — IBUPROFEN 600 MG PO TABS: 600.0000 mg | ORAL_TABLET | Freq: Four times a day (QID) | ORAL | 0 refills | Status: AC

## 2019-01-29 MED ORDER — ACETAMINOPHEN 325 MG PO TABS: 650.0000 mg | ORAL_TABLET | ORAL | Status: AC | PRN

## 2019-01-29 NOTE — Progress Notes (Signed)
Post Partum Day 1  Subjective: Doing well, no concerns. Ambulating without difficulty, pain managed with PO meds, tolerating regular diet, and voiding without difficulty.   No fever/chills, chest pain, shortness of breath, nausea/vomiting, or leg pain. No nipple or breast pain.   Objective: BP 111/75 (BP Location: Right Arm)   Pulse 84   Temp 98.5 F (36.9 C) (Oral)   Resp 18   Ht 5\' 6"  (1.676 m)   Wt 75.8 kg   LMP 05/07/2018   SpO2 98%   Breastfeeding Unknown   BMI 26.95 kg/m    Physical Exam:  General: alert, cooperative, appears stated age and no distress Breasts: soft/nontender CV: RRR Pulm: nl effort, CTABL Abdomen: soft, non-tender, active bowel sounds Uterine Fundus: firm Lochia: appropriate DVT Evaluation: No evidence of DVT seen on physical exam. No cords or calf tenderness. No significant calf/ankle edema.  Recent Labs    01/28/19 1041 01/29/19 0357  HGB 12.5 11.2*  HCT 36.6 33.1*  WBC 12.4* 13.1*  PLT 149* 152    Assessment/Plan: 19 y.o. G2P1011 postpartum day # 1  -Continue routine postpartum care -Lactation consult PRN for breastfeeding  -Plans OCPs for contraception -Immunization status: all immunizations up to date  Disposition: Desires discharge home today if baby cleared at 24 hours   LOS: 1 day   Lisette Grinder, CNM 01/29/2019, 8:36 AM   ----- Lisette Grinder Certified Nurse Midwife [facility]

## 2019-01-29 NOTE — Progress Notes (Signed)
Reviewed D/C instructions with pt and family. Pt verbalized understanding of teaching. Discharged to home via W/C. Pt to schedule f/u appt.  

## 2019-01-29 NOTE — Plan of Care (Signed)
Vs stable; up ad lib; tolerating a regular diet; taking tylenol and motrin for pain control; breastfeeding and does need some assistance but also doing formula

## 2019-02-02 NOTE — Anesthesia Postprocedure Evaluation (Signed)
Anesthesia Post Note  Patient: Cynthia Larson  Procedure(s) Performed: AN AD [place]  Patient location during evaluation: Mother Baby Anesthesia Type: Epidural Level of consciousness: awake and alert Pain management: pain level controlled Vital Signs Assessment: post-procedure vital signs reviewed and stable Respiratory status: spontaneous breathing, nonlabored ventilation and respiratory function stable Cardiovascular status: stable Postop Assessment: no headache, no backache and epidural receding Anesthetic complications: no     Last Vitals: There were no vitals filed for this visit.  Last Pain: There were no vitals filed for this visit.               Alphonsus Sias

## 2020-05-23 IMAGING — US US PELVIS COMPLETE
1 series · 13 of 25 positions shown · non-contrast
Comparison: Prior CT and ultrasound from 06/05/2017

CLINICAL DATA: Initial evaluation for acute right lower quadrant
abdominal pain. History of prior left oophorectomy.

EXAM:
TRANSABDOMINAL AND TRANSVAGINAL ULTRASOUND OF PELVIS
DOPPLER ULTRASOUND OF OVARIES
TECHNIQUE: Both transabdominal and transvaginal ultrasound examinations of the
pelvis were performed. Transabdominal technique was performed for
global imaging of the pelvis including uterus, ovaries, adnexal
regions, and pelvic cul-de-sac.
It was necessary to proceed with endovaginal exam following the
transabdominal exam to visualize the uterus, endometrium, and
ovaries. Color and duplex Doppler ultrasound was utilized to
evaluate blood flow to the ovaries.

[Series 1: us pelvis complete · 0.17mm/px · 13 of 103 slices shown]
[im 1/103]
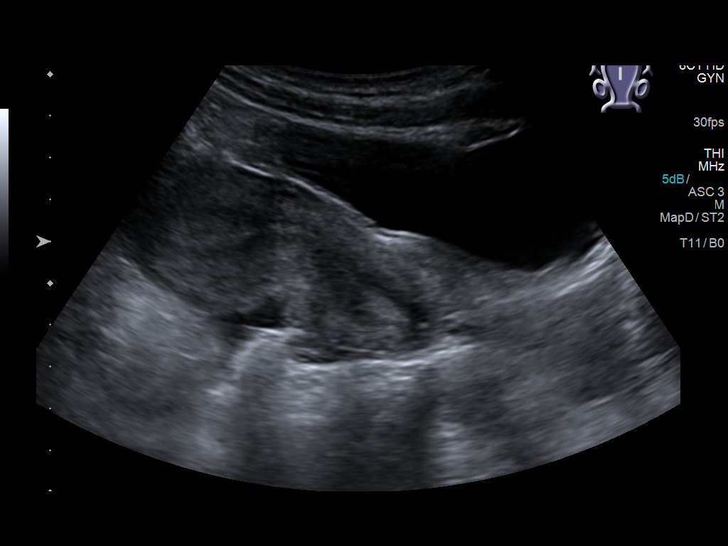
[im 9/103]
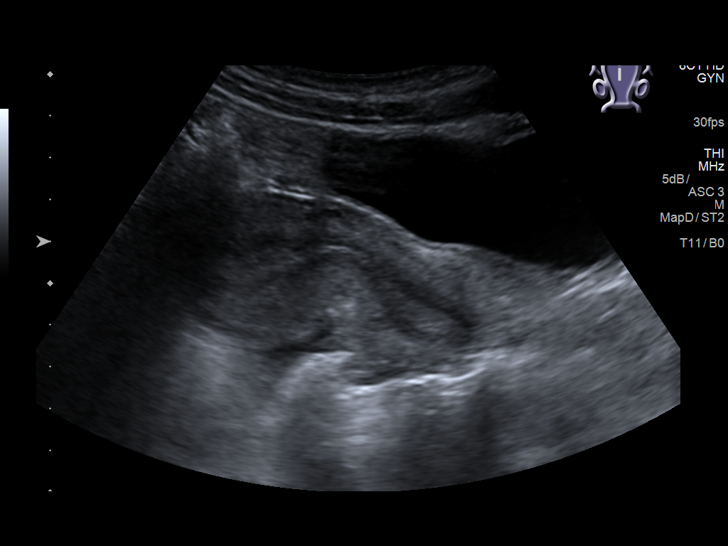
[im 18/103]
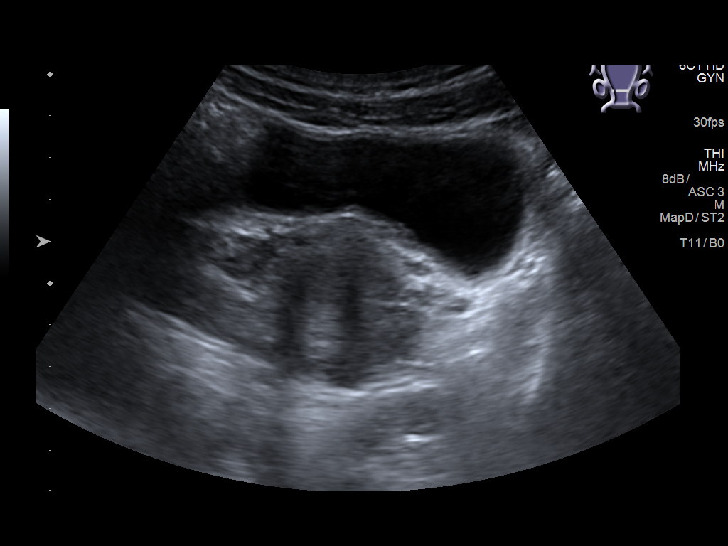
[im 26/103]
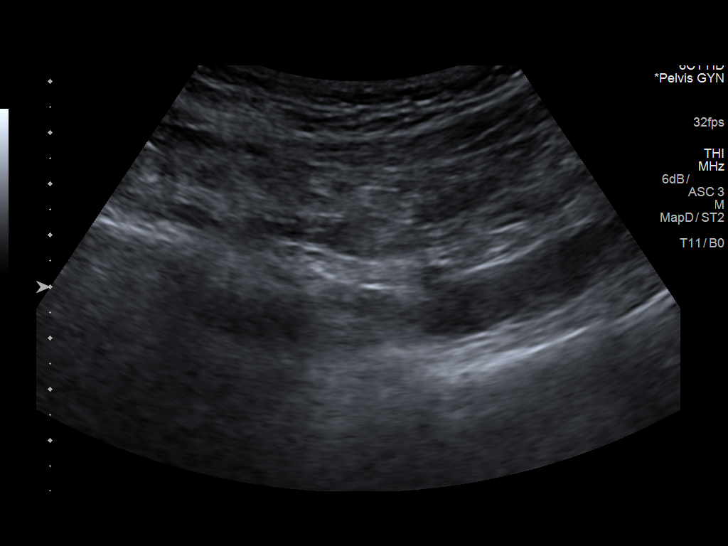
[im 35/103]
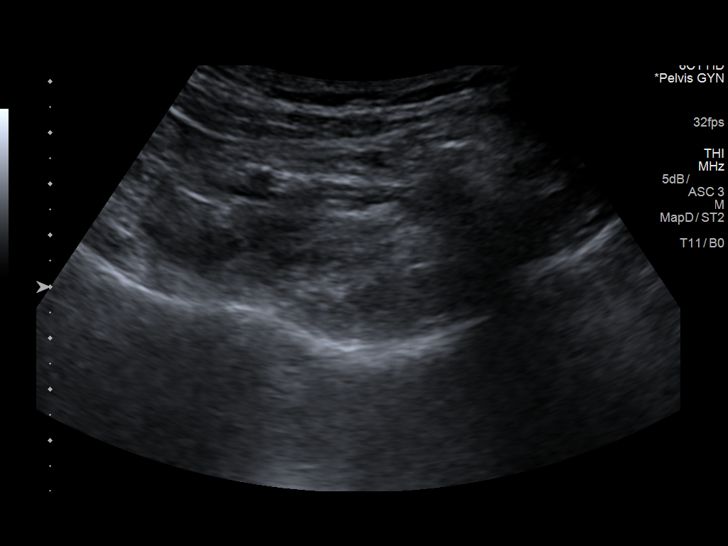
[im 43/103]
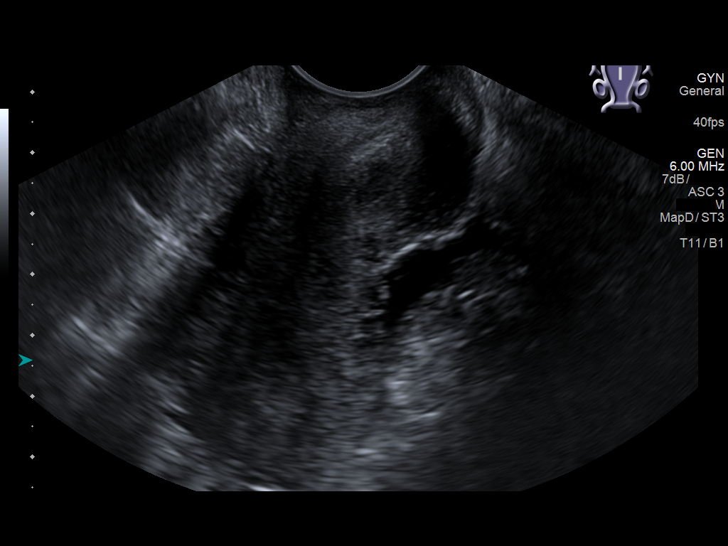
[im 52/103]
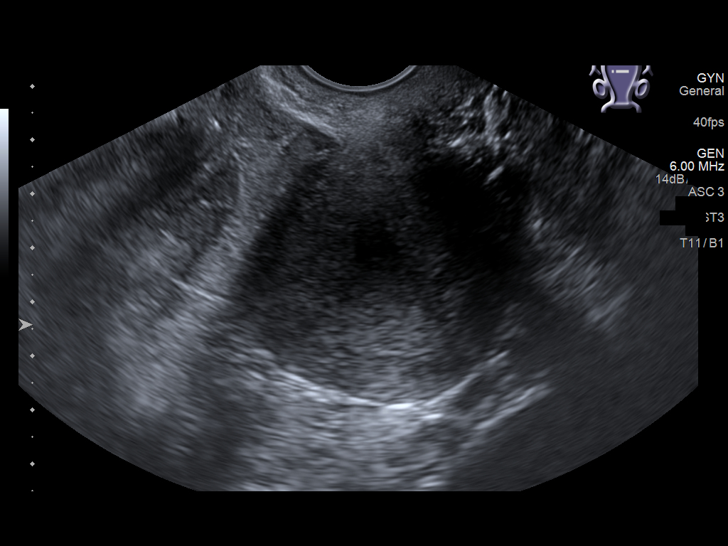
[im 60/103]
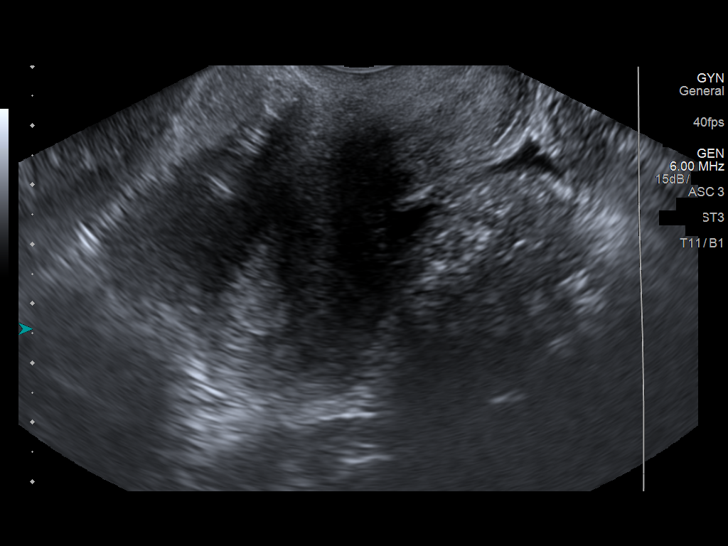
[im 69/103]
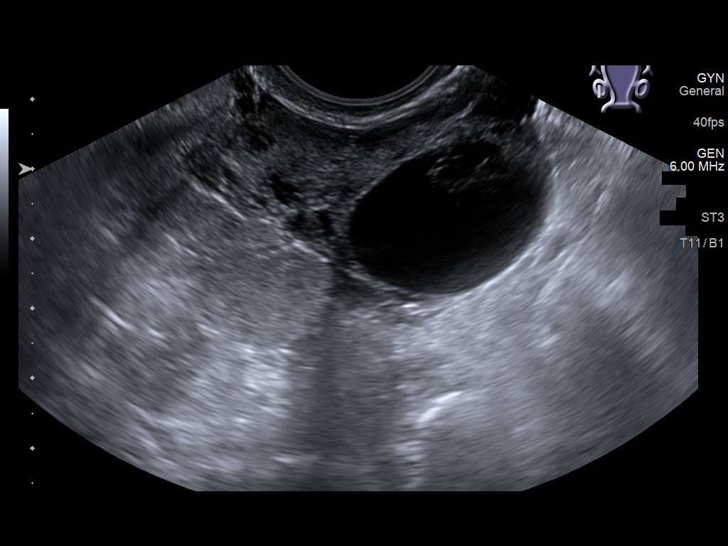
[im 77/103]
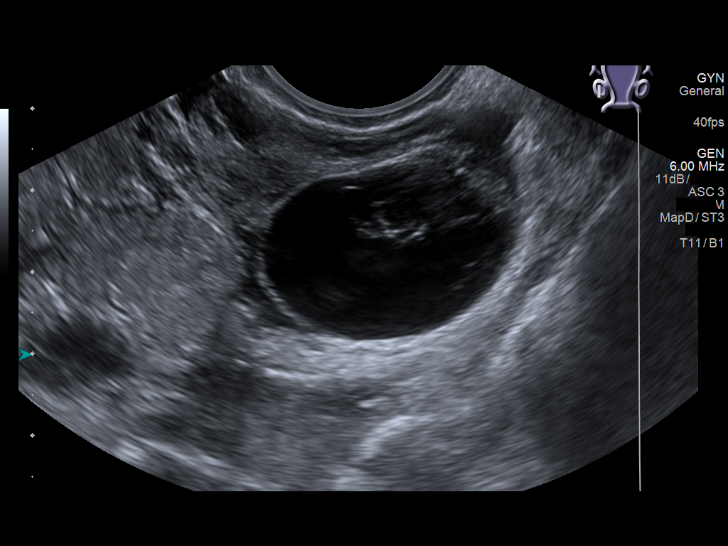
[im 86/103]
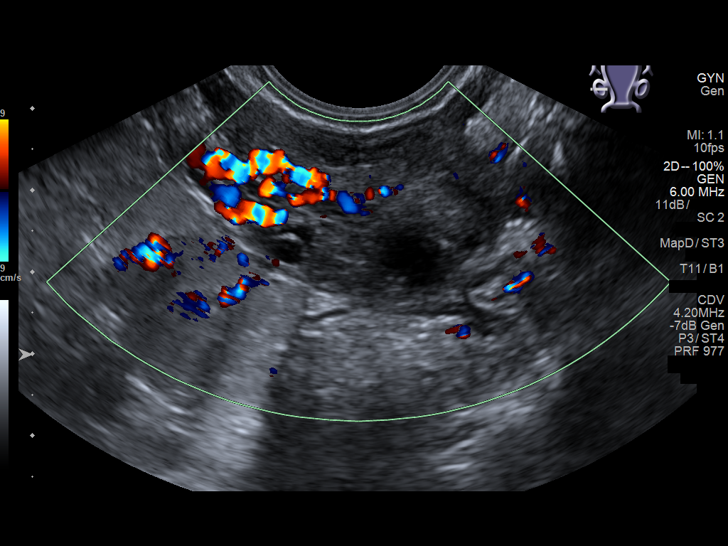
[im 94/103]
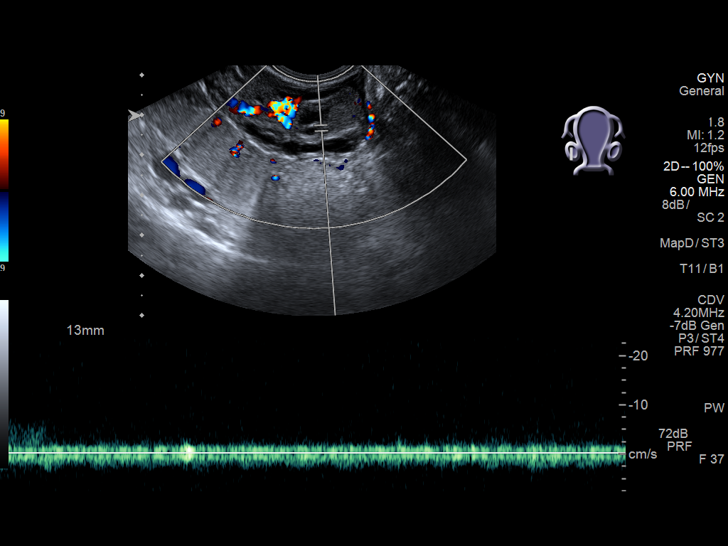
[im 103/103]
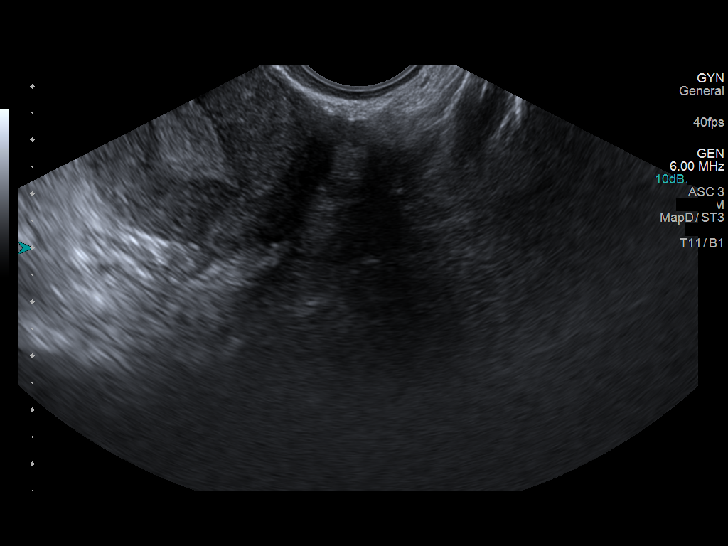

[13 of 25 positions shown; findings below may reference images not displayed]

FINDINGS: Uterus

Measurements: 7.1 x 3.4 x 4.7 cm = volume: 50.8 mL. No fibroids or
other mass visualized.

Endometrium

Thickness: 13.1 mm.  No focal abnormality visualized.

Right ovary

Measurements: 4.2 x 2.5 x 3.5 cm = volume: 19.5 mL. 3.0 x 2.9 x
cm mildly complex cyst with scattered internal lace-like
architecture, most consistent with a benign hemorrhagic cyst with
internal retractile clot. No internal vascularity or solid
nodularity.

Left ovary

Surgically absent.  No adnexal mass.

Pulsed Doppler evaluation of both ovaries demonstrates normal
low-resistance arterial and venous waveforms.

Other findings

Small volume free fluid within the pelvis, presumably physiologic.
IMPRESSION: 1. 3.0 cm complex right ovarian cyst, most consistent with a benign
hemorrhagic cyst. Associated small volume free physiologic fluid
within the pelvis.
2. No other acute abnormality identified.  No evidence for torsion.
3. Prior left oophorectomy.

## 2020-06-30 IMAGING — US US OB COMP LESS 14 WK
1 series · 13 of 28 positions shown · non-contrast
Comparison: Prior ultrasound from 12/06/2017.

CLINICAL DATA: Initial evaluation for acute vaginal bleeding, early
pregnancy.

EXAM:
OBSTETRIC <14 WK ULTRASOUND
TECHNIQUE: Transabdominal ultrasound was performed for evaluation of the
gestation as well as the maternal uterus and adnexal regions.

[Series 1: us ob comp less 14 wk · 50 acquisitions, 13 frames shown]
[im 2/50]
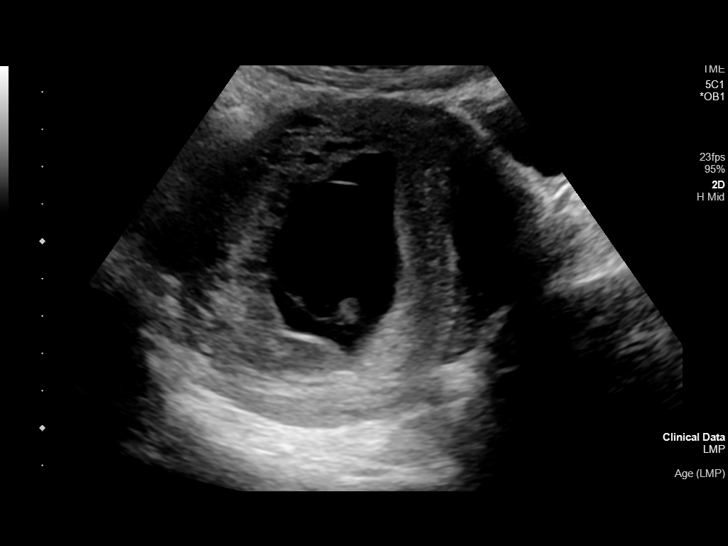
[im 6/50]
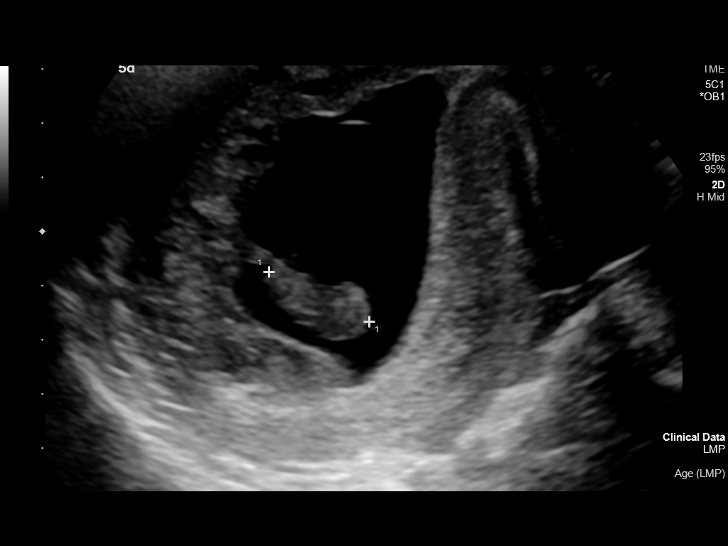
[im 10/50]
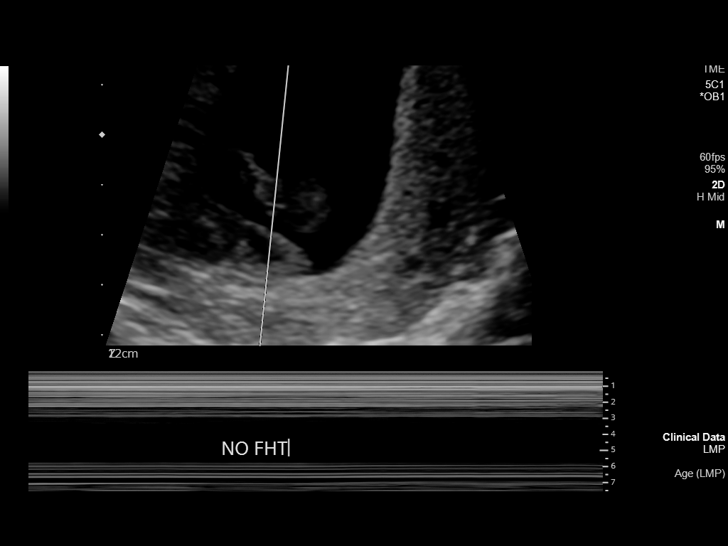
[im 13/50]
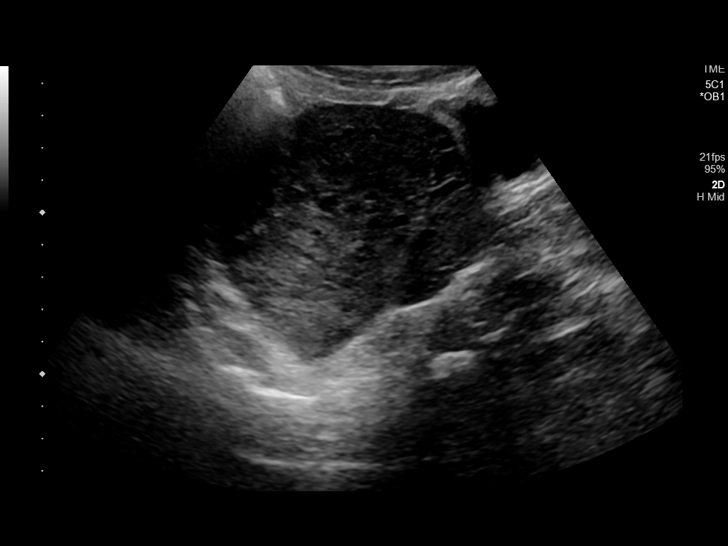
[im 17/50]
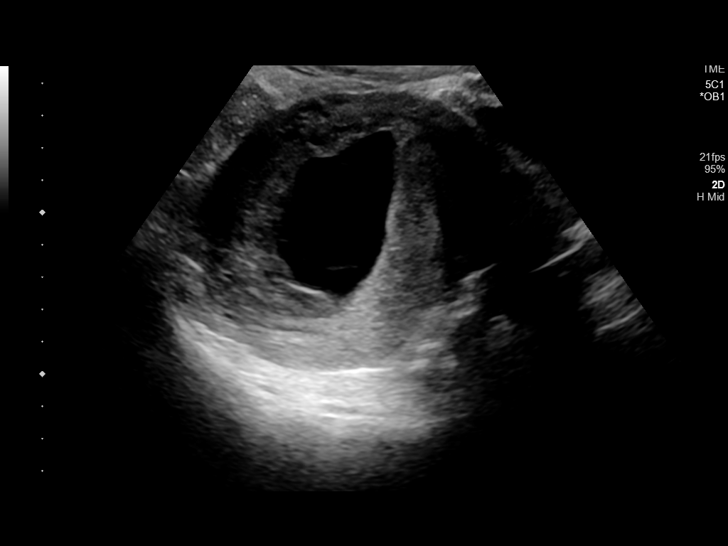
[im 20/50]
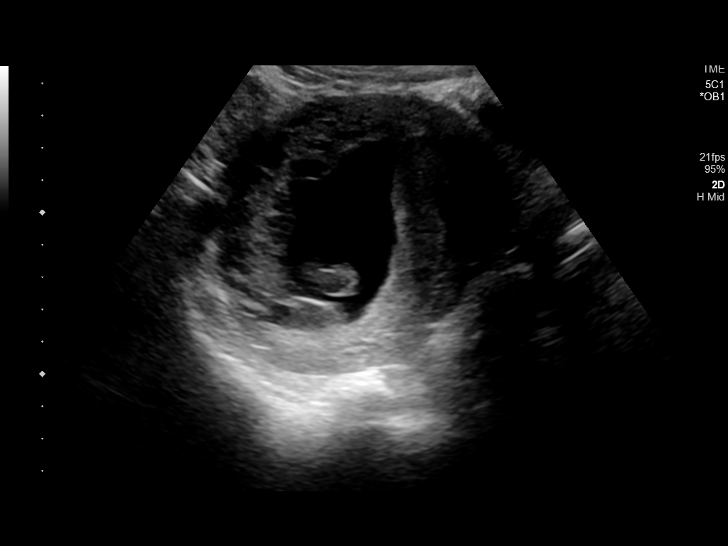
[im 26/50]
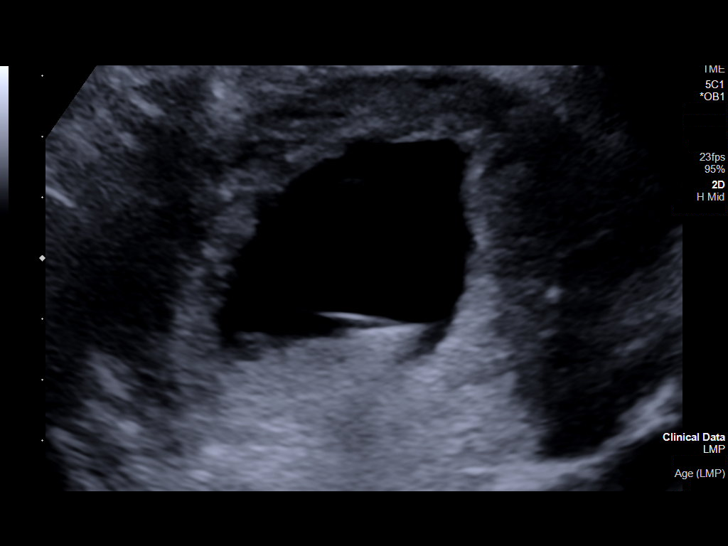
[im 30/50]
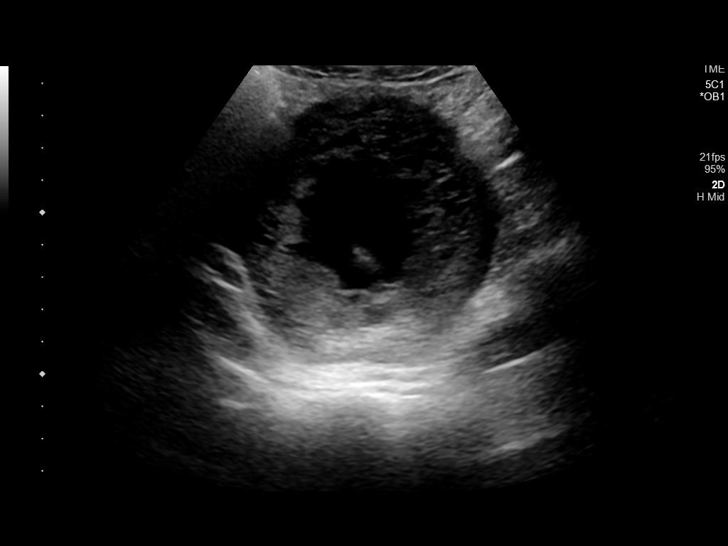
[im 33/50]
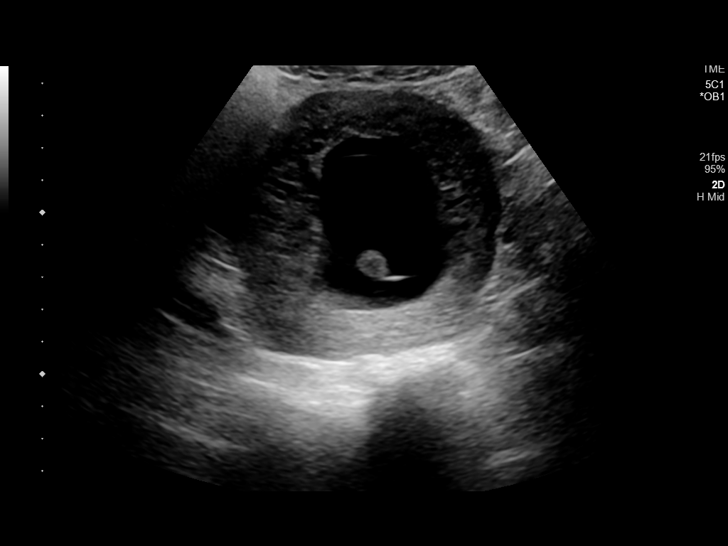
[im 37/50]
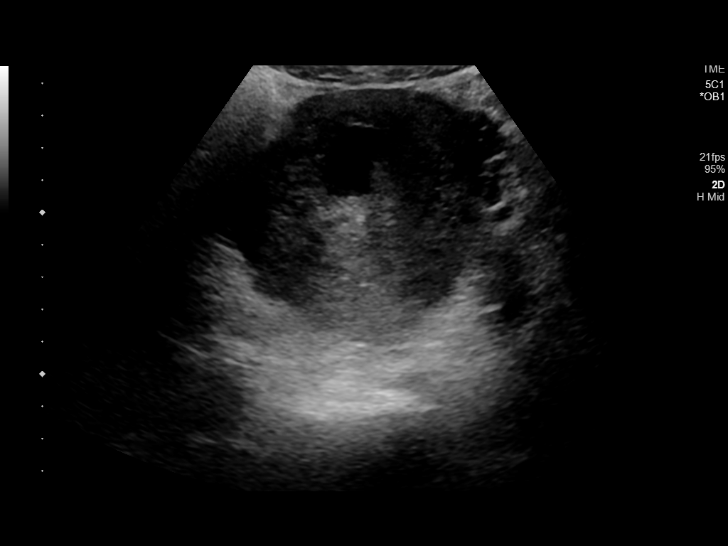
[im 40/50]
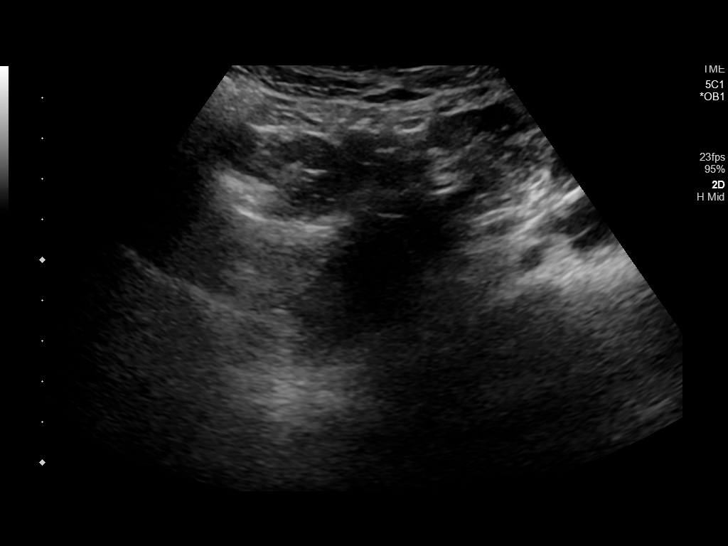
[im 44/50]
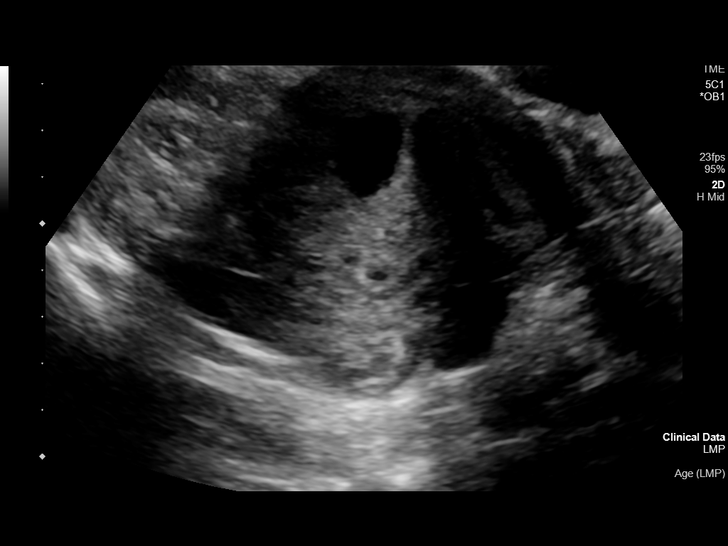
[im 48/50]
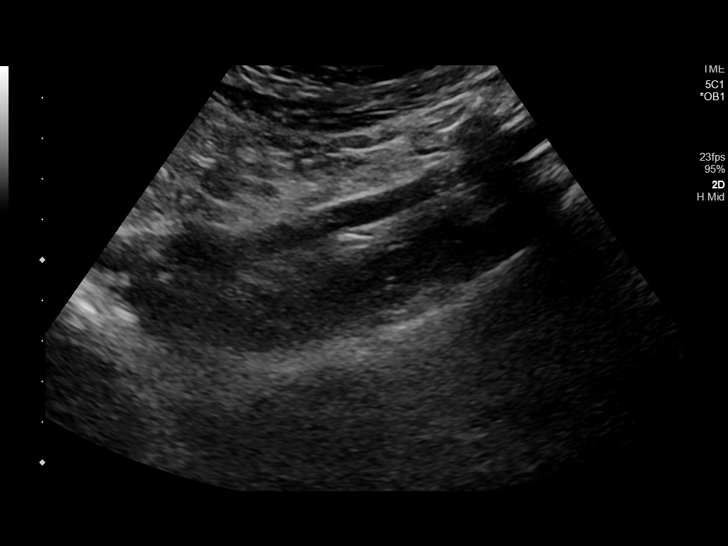

[13 of 28 positions shown; findings below may reference images not displayed]

FINDINGS: Intrauterine gestational sac: Single. Margins of the gestational sac
somewhat irregular in contour.

Yolk sac:  Not visualized.

Embryo:  Present

Cardiac Activity: Negative.

Heart Rate: No fetal heart tone identified.

CRL: 21.2 mm 8 w 5 d                  US EDC: N/A

Subchorionic hemorrhage:  None visualized.

Maternal uterus/adnexae: Right ovary normal in appearance. Left
ovary not visualized, reportedly surgically absent. No adnexal mass.
No free fluid within the pelvis.
IMPRESSION: 1. Single intrauterine pregnancy with internal embryo, crown-rump
length measuring 21.2 mm in length, with no discernible cardiac
activity. Findings meet definitive criteria for failed pregnancy.
This follows SRU consensus guidelines: Diagnostic Criteria for
Nonviable Pregnancy Early in the First Trimester. N Engl J Med
2. No other acute maternal uterine or adnexal abnormality
identified.

## 2023-09-04 ENCOUNTER — Encounter (HOSPITAL_BASED_OUTPATIENT_CLINIC_OR_DEPARTMENT_OTHER): Payer: Self-pay | Admitting: Urology

## 2023-09-04 ENCOUNTER — Emergency Department (HOSPITAL_BASED_OUTPATIENT_CLINIC_OR_DEPARTMENT_OTHER)
Admission: EM | Admit: 2023-09-04 | Discharge: 2023-09-04 | Disposition: A | Discharge status: Home / Self Care | Payer: Self-pay

## 2023-09-04 ENCOUNTER — Other Ambulatory Visit: Payer: Self-pay

## 2023-09-04 DIAGNOSIS — H9202 Otalgia, left ear: Secondary | ICD-10-CM | POA: Insufficient documentation

## 2023-09-04 MED ORDER — AMOXICILLIN 500 MG PO CAPS: 500.0000 mg | ORAL_CAPSULE | Freq: Two times a day (BID) | ORAL | 0 refills | Status: AC

## 2023-09-04 MED ORDER — CELECOXIB 200 MG PO CAPS: 200.0000 mg | ORAL_CAPSULE | Freq: Two times a day (BID) | ORAL | 0 refills | Status: AC | PRN

## 2023-09-04 MED ORDER — CIPROFLOXACIN HCL 0.3 % OP SOLN
2.0000 [drp] | Freq: Two times a day (BID) | OPHTHALMIC | 0 refills | Status: DC
Start: 2023-09-04 — End: 2023-09-04

## 2023-09-04 MED ORDER — AMOXICILLIN 500 MG PO CAPS: 500.0000 mg | ORAL_CAPSULE | Freq: Two times a day (BID) | ORAL | 0 refills | Status: DC

## 2023-09-04 MED ORDER — DEXAMETHASONE 0.1 % OP SUSP
2.0000 [drp] | Freq: Two times a day (BID) | OPHTHALMIC | 0 refills | Status: DC
Start: 2023-09-04 — End: 2023-09-04

## 2023-09-04 MED ORDER — DEXAMETHASONE 0.1 % OP SUSP: 2.0000 [drp] | Freq: Two times a day (BID) | OPHTHALMIC | 0 refills | Status: AC

## 2023-09-04 MED ORDER — CIPROFLOXACIN HCL 0.3 % OP SOLN: 2.0000 [drp] | Freq: Two times a day (BID) | OPHTHALMIC | 0 refills | Status: AC

## 2023-09-04 MED ORDER — CELECOXIB 200 MG PO CAPS: 200.0000 mg | ORAL_CAPSULE | Freq: Two times a day (BID) | ORAL | 0 refills | Status: DC | PRN

## 2023-09-04 NOTE — ED Provider Notes (Signed)
 [place] EMERGENCY DEPARTMENT AT [facility] Provider Note   CSN: [id number] Arrival date & time: 09/04/23  1844     Patient presents with: Cynthia Larson is a 23 y.o. female.    Otalgia   23 year old female presents emergency department left ear pain.  Patient was at [place] at [place] earlier today.  States that she jumped into the water  felt a popping sensation in her left ear and has had pain since then.  Denies any drainage.  Does report her ear is slightly muffled.  Denies any pain/trauma elsewhere.  Did put peroxide in her ear to try to clean it out which did not help.  Past medical history significant for IBS, depression  Prior to Admission medications   Medication Sig Start Date End Date Taking? Authorizing Provider  acetaminophen  (TYLENOL ) 325 MG tablet Take 2 tablets (650 mg total) by mouth every 4 (four) hours as needed for mild pain or moderate pain. 01/29/19   Dean Quant, CNM  cetirizine (ZYRTEC) 10 MG tablet Take 10 mg by mouth daily.    [provider]  ibuprofen  (ADVIL ) 600 MG tablet Take 1 tablet (600 mg total) by mouth every 6 (six) hours. 01/29/19   Dean Quant, CNM  Prenatal Vit-Fe Fumarate-FA (PRENATAL MULTIVITAMIN) TABS tablet Take 1 tablet by mouth daily at 12 noon.    [provider]    Allergies: Patient has no known allergies.    Review of Systems  HENT:  Positive for ear pain.   All other systems reviewed and are negative.   Updated Vital Signs BP 133/84 (BP Location: Right Arm)   Pulse 79   Temp 98.3 F (36.8 C)   Resp 20   Ht 5' 6 (1.676 m)   Wt 75.8 kg   LMP 08/19/2023   SpO2 100%   BMI 26.97 kg/m   Physical Exam Vitals and nursing note reviewed.  Constitutional:      General: She is not in acute distress.    Appearance: She is well-developed.  HENT:     Head: Normocephalic and atraumatic.     Ears:     Comments: TMs appear intact bilaterally.  Slightly erythematous as well as  opaque appearing left-sided TM when compared to right.  Swelling appreciated left external auditory canal with small amount of macerated tissue present.  External ear unremarkable bilaterally.  No obvious perforations in TM. Eyes:     Conjunctiva/sclera: Conjunctivae normal.  Cardiovascular:     Rate and Rhythm: Normal rate and regular rhythm.     Heart sounds: No murmur heard. Pulmonary:     Effort: Pulmonary effort is normal. No respiratory distress.     Breath sounds: Normal breath sounds.  Abdominal:     Palpations: Abdomen is soft.     Tenderness: There is no abdominal tenderness.  Musculoskeletal:        General: No swelling.     Cervical back: Neck supple.  Skin:    General: Skin is warm and dry.     Capillary Refill: Capillary refill takes less than 2 seconds.  Neurological:     Mental Status: She is alert.  Psychiatric:        Mood and Affect: Mood normal.     (all labs ordered are listed, but only abnormal results are displayed) Labs Reviewed - No data to display  EKG: None  Radiology: No results found.   Procedures   Medications Ordered in the ED - No data to  display                                  Medical Decision Making  /This patient presents to the ED for concern of ear pain, this involves an extensive number of treatment options, and is a complaint that carries with it a high risk of complications and morbidity.  The differential diagnosis includes otitis media, otitis externa, Ramsay Hunt syndrome, malignant otitis externa, perforated TM, other   Co morbidities that complicate the patient evaluation  See HPI   Additional history obtained:  Additional history obtained from EMR External records from outside source obtained and reviewed including hospital records   Lab Tests:  N/a   Imaging Studies ordered:  N/a   Cardiac Monitoring: / EKG:  N/a   Consultations Obtained:  N/a   Problem List / ED Course / Critical  interventions / Medication management  Left ear pain Reevaluation of the patient showed that the patient stayed the same I have reviewed the patients home medicines and have made adjustments as needed   Social Determinants of Health:  Denies tobacco, cigarette use.   Test / Admission - Considered:  Left ear pain Vitals signs within normal range and stable throughout visit. 23 year old female presents emergency department left ear pain.  Patient was at [place] at [place] earlier today.  States that she jumped into the water  felt a popping sensation in her left ear and has had pain since then.  Denies any drainage.  Does report her ear is slightly muffled.  Denies any pain/trauma elsewhere.  Did put peroxide in her ear to try to clean it out which did not help. On exam, inflamed/swollen appearing left-sided external auditory canal when compared contralaterally.  Discharge/maceration present as well.  TM intact with mild as well as more opaque appearing compared to contralateral aspect.  Will treat empirically with abx and recommend follow-up with ENT in the outpatient setting for reassessment.  Treatment plan discussed with patient and she acknowledged understanding was agreeable to said plan.  Patient well-appearing, afebrile in no acute distress. Worrisome signs and symptoms were discussed with the patient, and the patient acknowledged understanding to return to the ED if noticed. Patient was stable upon discharge.       Final diagnoses:  Left ear pain    ED Discharge Orders     None          Silver Wonda LABOR, [place] 09/04/23 1923    Simon Lavonia SAILOR, MD 09/05/23 TYRA

## 2023-09-04 NOTE — ED Notes (Signed)
 Reviewed discharge instructions, medications, and home care with pt. Pt verbalized understanding and had no further questions. Pt exited ED without complications.

## 2023-09-04 NOTE — Discharge Instructions (Signed)
 As discussed, your ear drum appears intact.  It does appear like you have an external ear infection with possible internal ear infection.  Will place on antibiotic eardrops as well as antibiotic pills.  Will send an anti-inflammatory to take for the pain.  Will attach information for ENT specialist to follow-up with.  Please do not hesitate to return to emergency department if the worrisome signs and symptoms discussed become apparent.

## 2023-09-04 NOTE — ED Triage Notes (Signed)
 [place] ear pop and now painful
